# Patient Record
Sex: Female | Born: 1945 | Race: White | Hispanic: No | Smoking: Former smoker
Health system: Southern US, Community
[De-identification: ages and names within clinical notes are randomized; demographics above are authoritative.]

## PROBLEM LIST (undated history)

## (undated) DIAGNOSIS — N189 Chronic kidney disease, unspecified: Secondary | ICD-10-CM

## (undated) DIAGNOSIS — R0600 Dyspnea, unspecified: Secondary | ICD-10-CM

## (undated) DIAGNOSIS — I10 Essential (primary) hypertension: Secondary | ICD-10-CM

## (undated) DIAGNOSIS — E119 Type 2 diabetes mellitus without complications: Secondary | ICD-10-CM

## (undated) DIAGNOSIS — G4489 Other headache syndrome: Secondary | ICD-10-CM

## (undated) DIAGNOSIS — M542 Cervicalgia: Secondary | ICD-10-CM

## (undated) DIAGNOSIS — G454 Transient global amnesia: Secondary | ICD-10-CM

## (undated) HISTORY — PX: EYE SURGERY: SHX253

## (undated) HISTORY — DX: Transient global amnesia: G45.4

## (undated) HISTORY — PX: APPENDECTOMY: SHX54

## (undated) HISTORY — DX: Other headache syndrome: G44.89

---

## 1998-02-11 ENCOUNTER — Emergency Department (HOSPITAL_COMMUNITY): Admission: EM | Admit: 1998-02-11 | Discharge: 1998-02-11 | Payer: Self-pay | Admitting: Emergency Medicine

## 1998-08-07 ENCOUNTER — Emergency Department (HOSPITAL_COMMUNITY): Admission: EM | Admit: 1998-08-07 | Discharge: 1998-08-07 | Payer: Self-pay | Admitting: Emergency Medicine

## 1999-09-07 ENCOUNTER — Encounter: Payer: Self-pay | Admitting: Orthopedic Surgery

## 1999-09-10 ENCOUNTER — Ambulatory Visit (HOSPITAL_COMMUNITY): Admission: RE | Admit: 1999-09-10 | Discharge: 1999-09-10 | Payer: Self-pay | Admitting: Orthopedic Surgery

## 2000-07-23 ENCOUNTER — Ambulatory Visit (HOSPITAL_COMMUNITY): Admission: RE | Admit: 2000-07-23 | Discharge: 2000-07-23 | Payer: Self-pay

## 2000-09-01 ENCOUNTER — Encounter: Payer: Self-pay | Admitting: Family Medicine

## 2000-09-01 ENCOUNTER — Ambulatory Visit (HOSPITAL_COMMUNITY): Admission: RE | Admit: 2000-09-01 | Discharge: 2000-09-01 | Payer: Self-pay | Admitting: Family Medicine

## 2000-12-12 ENCOUNTER — Ambulatory Visit (HOSPITAL_COMMUNITY): Admission: RE | Admit: 2000-12-12 | Discharge: 2000-12-12 | Payer: Self-pay | Admitting: Family Medicine

## 2000-12-12 ENCOUNTER — Encounter: Payer: Self-pay | Admitting: Family Medicine

## 2001-02-26 ENCOUNTER — Encounter: Payer: Self-pay | Admitting: Neurosurgery

## 2001-02-26 ENCOUNTER — Encounter: Admission: RE | Admit: 2001-02-26 | Discharge: 2001-02-26 | Payer: Self-pay | Admitting: Neurosurgery

## 2001-03-12 ENCOUNTER — Encounter: Admission: RE | Admit: 2001-03-12 | Discharge: 2001-04-01 | Payer: Self-pay | Admitting: Neurosurgery

## 2001-09-16 ENCOUNTER — Ambulatory Visit (HOSPITAL_COMMUNITY): Admission: RE | Admit: 2001-09-16 | Discharge: 2001-09-16 | Payer: Self-pay | Admitting: Family Medicine

## 2001-09-16 ENCOUNTER — Encounter: Payer: Self-pay | Admitting: Family Medicine

## 2001-12-07 ENCOUNTER — Encounter: Payer: Self-pay | Admitting: Family Medicine

## 2001-12-07 ENCOUNTER — Ambulatory Visit (HOSPITAL_COMMUNITY): Admission: RE | Admit: 2001-12-07 | Discharge: 2001-12-07 | Payer: Self-pay | Admitting: Family Medicine

## 2002-04-14 ENCOUNTER — Other Ambulatory Visit: Admission: RE | Admit: 2002-04-14 | Discharge: 2002-04-14 | Payer: Self-pay | Admitting: Family Medicine

## 2004-08-22 ENCOUNTER — Ambulatory Visit: Payer: Self-pay | Admitting: Family Medicine

## 2004-08-30 ENCOUNTER — Ambulatory Visit (HOSPITAL_COMMUNITY): Admission: RE | Admit: 2004-08-30 | Discharge: 2004-08-30 | Payer: Self-pay | Admitting: Family Medicine

## 2004-09-04 ENCOUNTER — Ambulatory Visit: Payer: Self-pay | Admitting: Family Medicine

## 2005-05-21 ENCOUNTER — Ambulatory Visit: Payer: Self-pay | Admitting: Family Medicine

## 2005-09-24 ENCOUNTER — Ambulatory Visit: Payer: Self-pay | Admitting: Family Medicine

## 2005-10-30 ENCOUNTER — Ambulatory Visit (HOSPITAL_COMMUNITY): Admission: RE | Admit: 2005-10-30 | Discharge: 2005-10-30 | Payer: Self-pay | Admitting: Family Medicine

## 2005-11-05 ENCOUNTER — Ambulatory Visit: Payer: Self-pay | Admitting: Family Medicine

## 2005-12-11 ENCOUNTER — Ambulatory Visit: Payer: Self-pay | Admitting: Family Medicine

## 2006-06-03 ENCOUNTER — Ambulatory Visit: Payer: Self-pay | Admitting: Family Medicine

## 2006-06-04 ENCOUNTER — Ambulatory Visit: Payer: Self-pay | Admitting: Family Medicine

## 2006-12-31 ENCOUNTER — Ambulatory Visit: Payer: Self-pay | Admitting: Family Medicine

## 2007-02-10 ENCOUNTER — Ambulatory Visit: Payer: Self-pay | Admitting: Family Medicine

## 2007-04-27 ENCOUNTER — Ambulatory Visit (HOSPITAL_COMMUNITY): Admission: RE | Admit: 2007-04-27 | Discharge: 2007-04-27 | Payer: Self-pay | Admitting: Family Medicine

## 2007-07-15 ENCOUNTER — Ambulatory Visit: Payer: Self-pay | Admitting: Internal Medicine

## 2008-01-26 ENCOUNTER — Ambulatory Visit: Payer: Self-pay | Admitting: Internal Medicine

## 2008-01-26 ENCOUNTER — Encounter (INDEPENDENT_AMBULATORY_CARE_PROVIDER_SITE_OTHER): Payer: Self-pay | Admitting: Family Medicine

## 2008-01-26 LAB — CONVERTED CEMR LAB
Albumin: 4.5 g/dL (ref 3.5–5.2)
Alkaline Phosphatase: 85 units/L (ref 39–117)
BUN: 15 mg/dL (ref 6–23)
CO2: 25 meq/L (ref 19–32)
Calcium: 9.8 mg/dL (ref 8.4–10.5)
Chloride: 98 meq/L (ref 96–112)
Glucose, Bld: 245 mg/dL — ABNORMAL HIGH (ref 70–99)
HDL: 42 mg/dL (ref >39)
Potassium: 4.3 meq/L (ref 3.5–5.3)
Sodium: 136 meq/L (ref 135–145)
Total Bilirubin: 0.7 mg/dL (ref 0.3–1.2)

## 2008-02-10 ENCOUNTER — Ambulatory Visit: Payer: Self-pay | Admitting: Internal Medicine

## 2008-05-02 ENCOUNTER — Encounter (INDEPENDENT_AMBULATORY_CARE_PROVIDER_SITE_OTHER): Payer: Self-pay | Admitting: Family Medicine

## 2008-05-02 ENCOUNTER — Ambulatory Visit: Payer: Self-pay | Admitting: Internal Medicine

## 2008-05-02 LAB — CONVERTED CEMR LAB
ALT: 18 units/L (ref 0–35)
Albumin: 4.5 g/dL (ref 3.5–5.2)
BUN: 16 mg/dL (ref 6–23)
CO2: 22 meq/L (ref 19–32)
Calcium: 9.5 mg/dL (ref 8.4–10.5)
Chloride: 102 meq/L (ref 96–112)
Creatinine, Ser: 0.81 mg/dL (ref 0.40–1.20)
Glucose, Bld: 110 mg/dL — ABNORMAL HIGH (ref 70–99)
Potassium: 4.8 meq/L (ref 3.5–5.3)
Total Protein: 7.7 g/dL (ref 6.0–8.3)

## 2008-05-13 ENCOUNTER — Ambulatory Visit: Payer: Self-pay | Admitting: Internal Medicine

## 2008-05-23 ENCOUNTER — Ambulatory Visit (HOSPITAL_COMMUNITY): Admission: RE | Admit: 2008-05-23 | Discharge: 2008-05-23 | Payer: Self-pay | Admitting: Family Medicine

## 2008-08-31 ENCOUNTER — Ambulatory Visit: Payer: Self-pay | Admitting: Internal Medicine

## 2008-08-31 ENCOUNTER — Encounter (INDEPENDENT_AMBULATORY_CARE_PROVIDER_SITE_OTHER): Payer: Self-pay | Admitting: Family Medicine

## 2008-08-31 LAB — CONVERTED CEMR LAB
AST: 18 units/L (ref 0–37)
Alkaline Phosphatase: 53 units/L (ref 39–117)
BUN: 19 mg/dL (ref 6–23)
Chloride: 104 meq/L (ref 96–112)
Glucose, Bld: 104 mg/dL — ABNORMAL HIGH (ref 70–99)
HDL: 52 mg/dL (ref >39)
Potassium: 4.8 meq/L (ref 3.5–5.3)
Sodium: 141 meq/L (ref 135–145)
Total Bilirubin: 0.5 mg/dL (ref 0.3–1.2)
Total CHOL/HDL Ratio: 3.2
Total Protein: 7.5 g/dL (ref 6.0–8.3)
VLDL: 26 mg/dL (ref 0–40)

## 2008-09-14 ENCOUNTER — Ambulatory Visit: Payer: Self-pay | Admitting: Internal Medicine

## 2009-07-10 ENCOUNTER — Ambulatory Visit: Payer: Self-pay | Admitting: Internal Medicine

## 2009-07-12 ENCOUNTER — Ambulatory Visit (HOSPITAL_COMMUNITY): Admission: RE | Admit: 2009-07-12 | Discharge: 2009-07-12 | Payer: Self-pay | Admitting: Internal Medicine

## 2010-12-21 NOTE — Op Note (Signed)
[place]. [facility]  Patient:    Joy Fitzgerald                     MRN: [id number] Proc. Date: 09/10/99 Adm. Date:  [id number] Attending:  Burnard Bunting Dictator:   Graylin Shiver. August Saucer, M.D.                           Operative Report  PREOPERATIVE DIAGNOSIS:  Right knee lateral meniscal tear.  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  A 1 x 1.5 cm  chondral loose body, and patellofemoral osteoarthritis.  PROCEDURE: 1. Right knee examination under anesthesia. 2. Diagnostic arthroscopy. 3. Removal of loose bodies from lateral gutter. 4. Chondroplasty, right femoral condyle. 5. Partial lateral meniscectomy on the partial anterior horn lateral meniscectomy.  SURGEON:  Dr. August Saucer.  ANESTHESIA:  General endotracheal.  ESTIMATED BLOOD LOSS:  None.  DRAINS:  None.  INDICATIONS:  Joy Fitzgerald is a 65 year old patient who has had a several month history of right knee pain, swelling, and locking.  MRI scan demonstrated a complex tear of the anterior and middle aspect of the lateral meniscus.  The patient presents for operative therapy.  OPERATIVE FINDINGS: 1. Examination under anesthesia -  Range of motion 5 to 130 degrees with stability    to varus and valgus, ACL and PCL intact.  The patella was noted to track    slightly laterally, although patella mobility was 1-1/2 quadrants medial and  lateral. 2. Diagnostic operative arthroscopy - Grade II chondromalacia over the lateral    facet of the patella, grade I chondromalacia within the trochlear groove.  A 1 x    1.5 cm loose body within the lateral gutter which was removed through a chondral    flap. 3. Intact medial compartment with intact articular cartilage and meniscus. 4. Intact ACL and PCL. 5. Complex degenerative tear, anterior horn, lateral meniscus, extending about /2    of the entire  circumferential length of the lateral meniscus.  There was a    corresponding area of grade III chondromalacia on the femur, and diffuse grade    III chondromalacia on the tibial plateau.  DESCRIPTION OF PROCEDURE:  The patient was brought to the operating room where general endotracheal anesthesia was induced.  Preoperative intravenous antibiotics were administered.  The knee was examined under anesthesia.  The right knee was  then fitted with the proximal side tracted.  The right knee and leg and hip were prepped with Duraprep solution and draped in a sterile manner.  ______ anatomy f the knee was identified, including the inferior pole, patella, tibia tubercle, unilateral borders of the patellar tendon, as well as the joint line medially and laterally.  The leg was then elevated and exsanguinated with the Esmarch wrap. The tourniquet was inflated.  The anterior/superior lateral portal was then created, and the inflow trocar was inserted.  Scope was then placed in the anterior inferolateral portal.  Diagnostic arthroscopy was performed.  Patellofemoral compartment was observed and found to have some grade II chondromalacia on the lateral facet of the patella.  There was some corresponding grade I chondromalacia within the trochlea and medial facet.  Overall, had some grade I changes, but looked allright.  The medial compartment was freed of loose bodies.  The lateral compartment had a 1 x 1.5 cm chondral fragment within  it.  This was removed under direct visualization.  The medial compartment was then entered.  The anterior inferomedial portal was created under direct visualization.  The chondral surface on the medial femoral condyle was intact.  Medial meniscus, tibia articular surface was intact.  PCL was also intact.  The patient was placed in a figure-of-four position.  The posterior aspect of the lateral meniscus was intact, but frayed.  There was a 1.5 x 1.5 cm area of  grade III chondromalacia surrounded by grade II chondromalacia on the femoral condyle.  The tibial plateau had diffuse grade III chondromalacia changes.  There was a horizontal cleavage tear on the anterior aspect of the lateral meniscus.  This was a full-thickness tear with a flap which was posterior.  Using a combination of the ______  and shaver, as well as another portal created at the medial equator of the patella, the anterior horn of the meniscus was resected.  This was a full-thickness horizontal cleavage tear which was unstable.  The meniscal nodes stable, meniscal rim could be preserved.  I also believe that the meniscal tear was the offending structure which caused the chondral injury on the femur and has been causing the chondromalacia on the tibia. The two flaps of the horizontal cleavage tear were unstable.  These were resected. The remaining posterior horn was stable after probing.  Bleeding points within he knee were controlled using electrocautery.  Chondroplasty was then performed on the loose cartilaginous fragments on the femoral condyle.  No exposed subchondral bone was noted, although there were grade III changes.  Following this, the knee was  thoroughly irrigated.  Portals were closed using 3-0 Nylon suture.  The knee was then placed in a bulky wrap, and the tourniquet was deflated after 1 hour and 10 minutes.  The patient was then transferred to the recovery room in stable condition. DD:  09/10/99 TD:  09/11/99 Job: [id number]

## 2013-03-26 ENCOUNTER — Other Ambulatory Visit (HOSPITAL_COMMUNITY): Payer: Self-pay | Admitting: Family Medicine

## 2013-03-26 DIAGNOSIS — Z1231 Encounter for screening mammogram for malignant neoplasm of breast: Secondary | ICD-10-CM

## 2013-05-03 ENCOUNTER — Other Ambulatory Visit (HOSPITAL_COMMUNITY): Payer: Self-pay | Admitting: Family Medicine

## 2013-05-03 ENCOUNTER — Ambulatory Visit (HOSPITAL_COMMUNITY)
Admission: RE | Admit: 2013-05-03 | Discharge: 2013-05-03 | Disposition: A | Discharge status: Home / Self Care | Payer: Medicare PPO | Source: Ambulatory Visit | Attending: Family Medicine | Admitting: Family Medicine

## 2013-05-03 DIAGNOSIS — Z1231 Encounter for screening mammogram for malignant neoplasm of breast: Secondary | ICD-10-CM

## 2016-09-26 ENCOUNTER — Ambulatory Visit
Admission: RE | Admit: 2016-09-26 | Discharge: 2016-09-26 | Disposition: A | Discharge status: Home / Self Care | Payer: Medicare PPO | Source: Ambulatory Visit | Attending: Family Medicine | Admitting: Family Medicine

## 2016-09-26 ENCOUNTER — Other Ambulatory Visit: Payer: Self-pay | Admitting: Family Medicine

## 2016-09-26 DIAGNOSIS — M545 Low back pain: Secondary | ICD-10-CM

## 2017-11-01 ENCOUNTER — Encounter (HOSPITAL_COMMUNITY): Payer: Self-pay

## 2017-11-01 ENCOUNTER — Emergency Department (HOSPITAL_COMMUNITY)
Admission: EM | Admit: 2017-11-01 | Discharge: 2017-11-01 | Disposition: A | Discharge status: Home / Self Care | Payer: Medicare HMO | Attending: Emergency Medicine | Admitting: Emergency Medicine

## 2017-11-01 ENCOUNTER — Emergency Department (HOSPITAL_COMMUNITY): Payer: Medicare HMO

## 2017-11-01 DIAGNOSIS — E119 Type 2 diabetes mellitus without complications: Secondary | ICD-10-CM | POA: Diagnosis not present

## 2017-11-01 DIAGNOSIS — M542 Cervicalgia: Secondary | ICD-10-CM | POA: Diagnosis present

## 2017-11-01 DIAGNOSIS — F17210 Nicotine dependence, cigarettes, uncomplicated: Secondary | ICD-10-CM | POA: Insufficient documentation

## 2017-11-01 DIAGNOSIS — I10 Essential (primary) hypertension: Secondary | ICD-10-CM | POA: Insufficient documentation

## 2017-11-01 DIAGNOSIS — R52 Pain, unspecified: Secondary | ICD-10-CM

## 2017-11-01 HISTORY — DX: Essential (primary) hypertension: I10

## 2017-11-01 HISTORY — DX: Cervicalgia: M54.2

## 2017-11-01 HISTORY — DX: Type 2 diabetes mellitus without complications: E11.9

## 2017-11-01 LAB — CBC WITH DIFFERENTIAL/PLATELET
BASOS PCT: 0 %
Basophils Absolute: 0 10*3/uL (ref 0.0–0.1)
Eosinophils Absolute: 0.1 10*3/uL (ref 0.0–0.7)
Eosinophils Relative: 1 %
HEMATOCRIT: 32.3 % — AB (ref 36.0–46.0)
HEMOGLOBIN: 10.1 g/dL — AB (ref 12.0–15.0)
LYMPHS ABS: 2.6 10*3/uL (ref 0.7–4.0)
LYMPHS PCT: 28 %
MCH: 27.7 pg (ref 26.0–34.0)
MCHC: 31.3 g/dL (ref 30.0–36.0)
MCV: 88.5 fL (ref 78.0–100.0)
MONOS PCT: 10 %
Monocytes Absolute: 1 10*3/uL (ref 0.1–1.0)
NEUTROS ABS: 5.8 10*3/uL (ref 1.7–7.7)
NEUTROS PCT: 61 %
Platelets: 305 10*3/uL (ref 150–400)
RBC: 3.65 MIL/uL — ABNORMAL LOW (ref 3.87–5.11)
RDW: 15.4 % (ref 11.5–15.5)
WBC: 9.5 10*3/uL (ref 4.0–10.5)

## 2017-11-01 LAB — BASIC METABOLIC PANEL
Anion gap: 11 (ref 5–15)
BUN: 15 mg/dL (ref 6–20)
CHLORIDE: 100 mmol/L — AB (ref 101–111)
CO2: 25 mmol/L (ref 22–32)
CREATININE: 1.46 mg/dL — AB (ref 0.44–1.00)
Calcium: 9.5 mg/dL (ref 8.9–10.3)
GFR calc non Af Amer: 35 mL/min — ABNORMAL LOW (ref >60)
GFR, EST AFRICAN AMERICAN: 40 mL/min — AB (ref >60)
Glucose, Bld: 145 mg/dL — ABNORMAL HIGH (ref 65–99)
Potassium: 3.9 mmol/L (ref 3.5–5.1)
Sodium: 136 mmol/L (ref 135–145)

## 2017-11-01 LAB — I-STAT TROPONIN, ED: Troponin i, poc: 0.01 ng/mL (ref 0.00–0.08)

## 2017-11-01 MED ORDER — MORPHINE SULFATE (PF) 4 MG/ML IV SOLN
4.0000 mg | Freq: Once | INTRAVENOUS | Status: AC
  Administered 2017-11-01: 4 mg via INTRAVENOUS
  Filled 2017-11-01: qty 1

## 2017-11-01 MED ORDER — KETOROLAC TROMETHAMINE 30 MG/ML IJ SOLN
15.0000 mg | Freq: Once | INTRAMUSCULAR | Status: AC
  Administered 2017-11-01: 15 mg via INTRAVENOUS
  Filled 2017-11-01: qty 1

## 2017-11-01 NOTE — ED Provider Notes (Signed)
[facility] EMERGENCY DEPARTMENT Provider Note   CSN: [id number] Arrival date & time: 11/01/17  4098     History   Chief Complaint No chief complaint on file.   HPI Joy Fitzgerald is a 72 y.o. female.  72 year old female with prior history of diabetes, hypertension, and chronic neck pain presents with complaint of acute on chronic neck pain.  Patient reports a prior history of "pinched nerves"in her neck.  This is a chronic problem.  She takes Percocets (10mg s) routinely for this pain.  Her last dose of Percocet was yesterday.  She reports that on Wednesday she did lots of heavy lifting of furniture.  After the lifting she began to have more pain on Thursday and then Friday.  She reports that the pain today radiates from the posterior aspect of her neck to both shoulders and into her anterior chest.  She not take anything for her pain today.  She reports that the pain is worse with rotation or flexion and extension of her neck.  She did fever, cough, shortness of breath, nausea, vomiting, diaphoresis, or other acute complaint.  She reports that she does not routinely come to the hospital for her chronic pain.  She denies prior history of coronary artery disease or cardiac issues.  The history is provided by the patient and a relative.  Neck Injury  This is a chronic problem. The current episode started more than 2 days ago. The problem occurs every several days. The problem has not changed since onset.Associated symptoms include chest pain. The symptoms are aggravated by twisting and bending. Nothing relieves the symptoms. She has tried acetaminophen for the symptoms. Improvement on treatment: She has taken Percocets at home without improvement.    Past Medical History:  Diagnosis Date  . Diabetes mellitus without complication (HCC)   . Hypertension   . Neck pain     There are no active problems to display for this patient.   History reviewed. No pertinent  surgical history.   OB History   None      Home Medications    Prior to Admission medications   Not on File    Family History No family history on file.  Social History Social History   Tobacco Use  . Smoking status: Current Every Day Smoker  . Smokeless tobacco: Never Used  Substance Use Topics  . Alcohol use: Never    Frequency: Never  . Drug use: Not on file     Allergies   Patient has no known allergies.   Review of Systems Review of Systems  Cardiovascular: Positive for chest pain.  Musculoskeletal: Positive for neck pain.  All other systems reviewed and are negative.    Physical Exam Updated Vital Signs BP 102/69   Pulse 78   Temp 99 F (37.2 C) (Oral)   Resp 14   SpO2 100%   Physical Exam  Constitutional: She is oriented to person, place, and time. She appears well-developed and well-nourished. No distress.  HENT:  Head: Normocephalic and atraumatic.  Mouth/Throat: Oropharynx is clear and moist.  Eyes: Pupils are equal, round, and reactive to light. Conjunctivae and EOM are normal.  Neck: Normal range of motion. Neck supple.  Cardiovascular: Normal rate, regular rhythm and normal heart sounds.  Pulmonary/Chest: Effort normal and breath sounds normal. No respiratory distress.  Abdominal: Soft. She exhibits no distension. There is no tenderness.  Musculoskeletal: Normal range of motion. She exhibits no edema or deformity.  Neurological: She is  alert and oriented to person, place, and time. She displays normal reflexes. No cranial nerve deficit or sensory deficit. She exhibits normal muscle tone. Coordination normal.  Alert Oriented No facial droop Normal speech Normal gait 5 out of 5 strength in both upper and lower extremities  Skin: Skin is warm and dry.  Psychiatric: She has a normal mood and affect.  Nursing note and vitals reviewed.    ED Treatments / Results  Labs (all labs ordered are listed, but only abnormal results are  displayed) Labs Reviewed  CBC WITH DIFFERENTIAL/PLATELET - Abnormal; Notable for the following components:      Result Value   RBC 3.65 (*)    Hemoglobin 10.1 (*)    HCT 32.3 (*)    All other components within normal limits  BASIC METABOLIC PANEL - Abnormal; Notable for the following components:   Chloride 100 (*)    Glucose, Bld 145 (*)    Creatinine, Ser 1.46 (*)    GFR calc non Af Amer 35 (*)    GFR calc Af Amer 40 (*)    All other components within normal limits  I-STAT TROPONIN, ED    EKG EKG Interpretation  Date/Time:  Saturday November 01 2017 13:52:42 EDT Ventricular Rate:  77 PR Interval:    QRS Duration: 88 QT Interval:  367 QTC Calculation: 416 R Axis:   95 Text Interpretation:  Sinus rhythm Low voltage with right axis deviation Confirmed by Kristine RoyalMessick, Cyanne Delmar [phone]([id number]) on 11/01/2017 2:17:12 PM   Radiology Dg Chest 1 View  Result Date: 11/01/2017 CLINICAL DATA:  Chest pain. EXAM: CHEST  1 VIEW COMPARISON:  None FINDINGS: Normal heart size. There is no pleural effusion or edema. Linear atelectasis identified within the left base. No airspace opacities. IMPRESSION: 1. Linear atelectasis noted in the left base. Electronically Signed   By: Signa Kellaylor  Stroud M.D.   On: 11/01/2017 14:51    Procedures Procedures (including critical care time)  Medications Ordered in ED Medications  morphine 4 MG/ML injection 4 mg (4 mg Intravenous Given 11/01/17 1457)  morphine 4 MG/ML injection 4 mg (4 mg Intravenous Given 11/01/17 1553)  ketorolac (TORADOL) 30 MG/ML injection 15 mg (15 mg Intravenous Given 11/01/17 1553)     Initial Impression / Assessment and Plan / ED Course  I have reviewed the triage vital signs and the nursing notes.  Pertinent labs & imaging results that were available during my care of the patient were reviewed by me and considered in my medical decision making (see chart for details).     MDM  Screen Complete  Patient's presentation is most consistent with  likely cervicalgia related to a reported history of a "pinched nerve."  Patient's pain is acute on chronic in nature.  Patient feels improved following ED administration of morphine and Toradol.  Screening labs do not suggest other pathology such as ACS or a pulmonary process.  Screening troponin is negative.  EKG is without findings suggestive of ischemia.  Chest x-ray is without significant acute findings.  Other baseline labs are without significant findings.  Patient does feel improved following her ED evaluation.  Close follow-up is advised.  Strict return precautions given to the patient and her daughter at bedside.  She will continue her home use of Percocet and also use Naprosyn concurrently for management of her pain until she can follow-up with her regular doctor on Monday.   Final Clinical Impressions(s) / ED Diagnoses   Final diagnoses:  Neck pain  ED Discharge Orders    None       Wynetta Fines, MD 11/01/17 1616

## 2017-11-01 NOTE — ED Triage Notes (Signed)
Patient complains of chronic neck pain due to pinched nerve in neck, describes as chronic. States that the pain is worse after moving furniture. Also small burn to right posterior shoulder after laying on hitting pad, blister intact

## 2017-11-01 NOTE — ED Notes (Signed)
Patient returned from XRay

## 2017-11-01 NOTE — Discharge Instructions (Addendum)
Continue to use Percocet and Naprosyn as previously prescribed. Return for any problem.

## 2017-11-01 NOTE — ED Notes (Signed)
Got patient from the waiting room into a gown took patient to the bathroom patient is now resting with family and the doctor at bedside

## 2017-11-01 NOTE — ED Notes (Signed)
Got the patient

## 2017-11-01 NOTE — ED Notes (Signed)
Patient transported to X-ray 

## 2018-05-26 ENCOUNTER — Emergency Department (HOSPITAL_COMMUNITY): Payer: Medicare HMO

## 2018-05-26 ENCOUNTER — Other Ambulatory Visit: Payer: Self-pay

## 2018-05-26 ENCOUNTER — Emergency Department (HOSPITAL_COMMUNITY)
Admission: EM | Admit: 2018-05-26 | Discharge: 2018-05-26 | Disposition: A | Discharge status: Home / Self Care | Payer: Medicare HMO | Attending: Emergency Medicine | Admitting: Emergency Medicine

## 2018-05-26 ENCOUNTER — Encounter (HOSPITAL_COMMUNITY): Payer: Self-pay | Admitting: Pharmacy Technician

## 2018-05-26 DIAGNOSIS — I10 Essential (primary) hypertension: Secondary | ICD-10-CM | POA: Insufficient documentation

## 2018-05-26 DIAGNOSIS — Z7984 Long term (current) use of oral hypoglycemic drugs: Secondary | ICD-10-CM | POA: Insufficient documentation

## 2018-05-26 DIAGNOSIS — Z7982 Long term (current) use of aspirin: Secondary | ICD-10-CM | POA: Insufficient documentation

## 2018-05-26 DIAGNOSIS — F17210 Nicotine dependence, cigarettes, uncomplicated: Secondary | ICD-10-CM | POA: Diagnosis not present

## 2018-05-26 DIAGNOSIS — Z79899 Other long term (current) drug therapy: Secondary | ICD-10-CM | POA: Diagnosis not present

## 2018-05-26 DIAGNOSIS — R4182 Altered mental status, unspecified: Secondary | ICD-10-CM | POA: Diagnosis present

## 2018-05-26 DIAGNOSIS — G454 Transient global amnesia: Secondary | ICD-10-CM | POA: Diagnosis not present

## 2018-05-26 DIAGNOSIS — E119 Type 2 diabetes mellitus without complications: Secondary | ICD-10-CM | POA: Insufficient documentation

## 2018-05-26 LAB — COMPREHENSIVE METABOLIC PANEL
ALT: 11 U/L (ref 0–44)
ANION GAP: 9 (ref 5–15)
AST: 19 U/L (ref 15–41)
Albumin: 3.5 g/dL (ref 3.5–5.0)
Alkaline Phosphatase: 49 U/L (ref 38–126)
BILIRUBIN TOTAL: 0.9 mg/dL (ref 0.3–1.2)
BUN: 20 mg/dL (ref 8–23)
CALCIUM: 9.5 mg/dL (ref 8.9–10.3)
CO2: 24 mmol/L (ref 22–32)
Chloride: 106 mmol/L (ref 98–111)
Creatinine, Ser: 1.87 mg/dL — ABNORMAL HIGH (ref 0.44–1.00)
GFR, EST AFRICAN AMERICAN: 30 mL/min — AB (ref >60)
GFR, EST NON AFRICAN AMERICAN: 26 mL/min — AB (ref >60)
Glucose, Bld: 109 mg/dL — ABNORMAL HIGH (ref 70–99)
Potassium: 4.2 mmol/L (ref 3.5–5.1)
SODIUM: 139 mmol/L (ref 135–145)
TOTAL PROTEIN: 6.5 g/dL (ref 6.5–8.1)

## 2018-05-26 LAB — APTT: APTT: 34 s (ref 24–36)

## 2018-05-26 LAB — I-STAT CHEM 8, ED
BUN: 23 mg/dL (ref 8–23)
CALCIUM ION: 1.2 mmol/L (ref 1.15–1.40)
CHLORIDE: 106 mmol/L (ref 98–111)
Creatinine, Ser: 1.9 mg/dL — ABNORMAL HIGH (ref 0.44–1.00)
Glucose, Bld: 105 mg/dL — ABNORMAL HIGH (ref 70–99)
HEMATOCRIT: 35 % — AB (ref 36.0–46.0)
Hemoglobin: 11.9 g/dL — ABNORMAL LOW (ref 12.0–15.0)
POTASSIUM: 4.2 mmol/L (ref 3.5–5.1)
SODIUM: 140 mmol/L (ref 135–145)
TCO2: 25 mmol/L (ref 22–32)

## 2018-05-26 LAB — CBC
HEMATOCRIT: 34.7 % — AB (ref 36.0–46.0)
Hemoglobin: 11.2 g/dL — ABNORMAL LOW (ref 12.0–15.0)
MCH: 30.8 pg (ref 26.0–34.0)
MCHC: 32.3 g/dL (ref 30.0–36.0)
MCV: 95.3 fL (ref 80.0–100.0)
Platelets: 256 10*3/uL (ref 150–400)
RBC: 3.64 MIL/uL — ABNORMAL LOW (ref 3.87–5.11)
RDW: 14.1 % (ref 11.5–15.5)
WBC: 6.4 10*3/uL (ref 4.0–10.5)
nRBC: 0 % (ref 0.0–0.2)

## 2018-05-26 LAB — RAPID URINE DRUG SCREEN, HOSP PERFORMED
Amphetamines: NOT DETECTED
Barbiturates: NOT DETECTED
Benzodiazepines: POSITIVE — AB
Cocaine: NOT DETECTED
OPIATES: NOT DETECTED
Tetrahydrocannabinol: NOT DETECTED

## 2018-05-26 LAB — CBG MONITORING, ED: Glucose-Capillary: 100 mg/dL — ABNORMAL HIGH (ref 70–99)

## 2018-05-26 LAB — URINALYSIS, ROUTINE W REFLEX MICROSCOPIC
Bilirubin Urine: NEGATIVE
Glucose, UA: NEGATIVE mg/dL
Hgb urine dipstick: NEGATIVE
Ketones, ur: NEGATIVE mg/dL
Leukocytes, UA: NEGATIVE
NITRITE: NEGATIVE
PH: 5 (ref 5.0–8.0)
Protein, ur: NEGATIVE mg/dL
SPECIFIC GRAVITY, URINE: 1.005 (ref 1.005–1.030)

## 2018-05-26 LAB — PROTIME-INR
INR: 1.11
PROTHROMBIN TIME: 14.2 s (ref 11.4–15.2)

## 2018-05-26 LAB — I-STAT TROPONIN, ED: TROPONIN I, POC: 0 ng/mL (ref 0.00–0.08)

## 2018-05-26 LAB — DIFFERENTIAL
Abs Immature Granulocytes: 0.01 10*3/uL (ref 0.00–0.07)
BASOS ABS: 0 10*3/uL (ref 0.0–0.1)
Basophils Relative: 1 %
EOS PCT: 4 %
Eosinophils Absolute: 0.2 10*3/uL (ref 0.0–0.5)
Immature Granulocytes: 0 %
LYMPHS ABS: 2.9 10*3/uL (ref 0.7–4.0)
LYMPHS PCT: 44 %
MONO ABS: 0.6 10*3/uL (ref 0.1–1.0)
Monocytes Relative: 9 %
Neutro Abs: 2.7 10*3/uL (ref 1.7–7.7)
Neutrophils Relative %: 42 %

## 2018-05-26 LAB — ETHANOL

## 2018-05-26 NOTE — ED Notes (Signed)
Pt returned from MRI, family remains at the bedside. Pt in NAD resting comfortably.

## 2018-05-26 NOTE — ED Triage Notes (Signed)
Pt arrives via EMS from home with reports of sudden onset AMS. LKW 1400 today. Pt alert to first name. Unable to recall last name, birthdate or age. VSS with EMS. 170/90, HR 50 sinus brady, CBG 125.

## 2018-05-26 NOTE — ED Provider Notes (Signed)
[facility] EMERGENCY DEPARTMENT Provider Note   CSN: [id number] Arrival date & time: 05/26/18  1522     History   Chief Complaint Chief Complaint  Patient presents with  . Altered Mental Status    HPI [place] Cancel is a 72 y.o. female.  HPI Patient is a 72 year old female presents the emergency department with acute onset confusion and memory loss.  She was last known well at 1400 today.  She is brought to the emergency department via EMS.  She denies weakness of her arms or legs.  No dysarthria.  No difficulty with her speech.  She does have a history of diabetes and hypertension.  She does not drink alcohol.  She does not smoke cigarettes.  No prior history of stroke.  She has difficulty remembering her last name and her date of birth.  She is had no recent head injury or trauma.  This is very abnormal for the patient per the family.  They state that since her symptoms initially began she has had some mild improvement in her memory but that is not back to baseline at this time.   Past Medical History:  Diagnosis Date  . Diabetes mellitus without complication (HCC)   . Hypertension   . Neck pain     There are no active problems to display for this patient.   History reviewed. No pertinent surgical history.   OB History   None      Home Medications    Prior to Admission medications   Medication Sig Start Date End Date Taking? Authorizing Provider  ALPRAZolam Prudy Feeler) 1 MG tablet Take 1 mg by mouth See admin instructions. Take 1 mg by mouth at bedtime and an additional 1 mg up to two times a day as needed for anxiety   Yes [provider]  aspirin EC 81 MG tablet Take 81 mg by mouth daily.   Yes [provider]  ferrous sulfate 325 (65 FE) MG EC tablet Take 325 mg by mouth daily with breakfast.   Yes [provider]  losartan (COZAAR) 100 MG tablet Take 100 mg by mouth daily.   Yes [provider]  metFORMIN  (GLUCOPHAGE) 500 MG tablet Take 500 mg by mouth 2 (two) times daily with a meal.   Yes [provider]  naproxen (NAPROSYN) 500 MG tablet Take 500 mg by mouth 2 (two) times daily with a meal.   Yes [provider]  nebivolol (BYSTOLIC) 10 MG tablet Take 10 mg by mouth daily.   Yes [provider]  omeprazole (PRILOSEC) 20 MG capsule Take 20 mg by mouth daily before breakfast.   Yes [provider]  oxyCODONE-acetaminophen (PERCOCET) 10-325 MG tablet Take 1 tablet by mouth See admin instructions. Take 1 tablet by mouth every four to six hours   Yes [provider]  pravastatin (PRAVACHOL) 40 MG tablet Take 40 mg by mouth daily after lunch.   Yes [provider]  vitamin B-12 (CYANOCOBALAMIN) 1000 MCG tablet Take 1,000 mcg by mouth daily.   Yes [provider]    Family History No family history on file.  Social History Social History   Tobacco Use  . Smoking status: Current Every Day Smoker  . Smokeless tobacco: Never Used  Substance Use Topics  . Alcohol use: Never    Frequency: Never  . Drug use: Not on file     Allergies   Patient has no known allergies.   Review of  Systems Review of Systems  All other systems reviewed and are negative.    Physical Exam Updated Vital Signs BP (!) 190/71   Pulse (!) 52   Temp 98.5 F (36.9 C) (Oral)   Resp 13   Ht 5\' 8"  (1.727 m)   Wt 81.6 kg   SpO2 97%   BMI 27.37 kg/m   Physical Exam  Constitutional: She is oriented to person, place, and time. She appears well-developed and well-nourished. No distress.  HENT:  Head: Normocephalic and atraumatic.  Eyes: Pupils are equal, round, and reactive to light. EOM are normal.  Neck: Normal range of motion.  Cardiovascular: Normal rate, regular rhythm and normal heart sounds.  Pulmonary/Chest: Effort normal and breath sounds normal.  Abdominal: Soft. She exhibits no distension. There is no tenderness.  Musculoskeletal:  Normal range of motion.  Neurological: She is alert and oriented to person, place, and time.  5/5 strength in major muscle groups of  bilateral upper and lower extremities. Speech normal. No facial asymetry.  Some memory impairment.  Does not know her last name.  Does not know her date of birth.  She is able to name a pen and watch.  Skin: Skin is warm and dry.  Psychiatric: She has a normal mood and affect. Judgment normal.  Nursing note and vitals reviewed.    ED Treatments / Results  Labs (all labs ordered are listed, but only abnormal results are displayed) Labs Reviewed  CBC - Abnormal; Notable for the following components:      Result Value   RBC 3.64 (*)    Hemoglobin 11.2 (*)    HCT 34.7 (*)    All other components within normal limits  COMPREHENSIVE METABOLIC PANEL - Abnormal; Notable for the following components:   Glucose, Bld 109 (*)    Creatinine, Ser 1.87 (*)    GFR calc non Af Amer 26 (*)    GFR calc Af Amer 30 (*)    All other components within normal limits  RAPID URINE DRUG SCREEN, HOSP PERFORMED - Abnormal; Notable for the following components:   Benzodiazepines POSITIVE (*)    All other components within normal limits  URINALYSIS, ROUTINE W REFLEX MICROSCOPIC - Abnormal; Notable for the following components:   Color, Urine STRAW (*)    All other components within normal limits  CBG MONITORING, ED - Abnormal; Notable for the following components:   Glucose-Capillary 100 (*)    All other components within normal limits  I-STAT CHEM 8, ED - Abnormal; Notable for the following components:   Creatinine, Ser 1.90 (*)    Glucose, Bld 105 (*)    Hemoglobin 11.9 (*)    HCT 35.0 (*)    All other components within normal limits  ETHANOL  PROTIME-INR  APTT  DIFFERENTIAL  I-STAT TROPONIN, ED    EKG None  Radiology Dg Chest 2 View  Result Date: 05/26/2018 CLINICAL DATA:  Altered mental status EXAM: CHEST - 2 VIEW COMPARISON:  11/01/2017 chest radiograph.  FINDINGS: Stable cardiomediastinal silhouette with normal heart size. No pneumothorax. No pleural effusion. Mild left basilar scarring versus atelectasis. No pulmonary edema. No acute consolidative airspace disease. IMPRESSION: Mild left basilar scarring versus atelectasis. Electronically Signed   By: Delbert Phenix M.D.   On: 05/26/2018 18:49   Ct Head Wo Contrast  Result Date: 05/26/2018 CLINICAL DATA:  Altered mental status.  No reported injury. EXAM: CT HEAD WITHOUT CONTRAST TECHNIQUE: Contiguous axial images were obtained from the base of the skull  through the vertex without intravenous contrast. COMPARISON:  None. FINDINGS: Brain: No evidence of parenchymal hemorrhage or extra-axial fluid collection. No mass lesion, mass effect, or midline shift. No CT evidence of acute infarction. Nonspecific mild subcortical and periventricular white matter hypodensity, most in keeping with chronic small vessel ischemic change. Cerebral volume is age appropriate. No ventriculomegaly. Vascular: No acute abnormality. Skull: No evidence of calvarial fracture. Sinuses/Orbits: No fluid levels. Tiny mucous retention cyst versus polyp in posterior right ethmoidal air cells. Other: Partial inferior left mastoid effusion. Clear right mastoid air cells. IMPRESSION: 1.  No evidence of acute intracranial abnormality. 2. Mild chronic small vessel ischemic changes in the cerebral white matter. 3. Nonspecific small partial left mastoid effusion. Electronically Signed   By: Delbert Phenix M.D.   On: 05/26/2018 18:01   Mr Brain Wo Contrast  Result Date: 05/26/2018 CLINICAL DATA:  Memory loss. Altered mental status. Last seen normal 1400 hours. EXAM: MRI HEAD WITHOUT CONTRAST TECHNIQUE: Multiplanar, multiecho pulse sequences of the brain and surrounding structures were obtained without intravenous contrast. COMPARISON:  Head CT earlier same day FINDINGS: Brain: Diffusion imaging does not show any acute or subacute infarction. There are  mild chronic small-vessel ischemic changes of the pons. Few old small vessel cerebellar infarctions. Cerebral hemispheres show mild to moderate chronic small-vessel change of the white matter. No cortical or large vessel territory infarction. No mass lesion, hemorrhage, hydrocephalus or extra-axial collection. Vascular: Major vessels at the base of the brain show flow. Skull and upper cervical spine: Negative Sinuses/Orbits: Clear/normal Other: None IMPRESSION: No acute finding. Mild to moderate chronic small-vessel ischemic changes of the pons and the cerebral hemispheric white matter. Electronically Signed   By: Paulina Fusi M.D.   On: 05/26/2018 20:18    Procedures Procedures (including critical care time)  Medications Ordered in ED Medications - No data to display   Initial Impression / Assessment and Plan / ED Course  I have reviewed the triage vital signs and the nursing notes.  Pertinent labs & imaging results that were available during my care of the patient were reviewed by me and considered in my medical decision making (see chart for details).     Work-up in the emergency department is without significant abnormality.  MRI demonstrates no evidence of acute stroke.  This is likely transient global amnesia.  I do not believe the patient needs additional work-up in the ER at this time.  She does not need admission to the hospital.  She is stable for discharge from the ER with outpatient neurology work-up.  Her memory has continued to improve here in the emergency department.  Outpatient EEG may be beneficial.  No seizure activity noted by family.  No tongue biting.  No urinary incontinence.  Patient family encouraged to return to the emergency department for new or worsening symptoms  Final Clinical Impressions(s) / ED Diagnoses   Final diagnoses:  Transient global amnesia    ED Discharge Orders    None       Azalia Bilis, MD 05/26/18 2234

## 2018-05-26 NOTE — ED Notes (Signed)
ED Provider at bedside. 

## 2018-06-12 ENCOUNTER — Other Ambulatory Visit: Payer: Self-pay

## 2018-06-12 ENCOUNTER — Emergency Department (HOSPITAL_COMMUNITY): Payer: Medicare HMO

## 2018-06-12 ENCOUNTER — Encounter (HOSPITAL_COMMUNITY): Payer: Self-pay

## 2018-06-12 ENCOUNTER — Emergency Department (HOSPITAL_COMMUNITY)
Admission: EM | Admit: 2018-06-12 | Discharge: 2018-06-12 | Disposition: A | Discharge status: Home / Self Care | Payer: Medicare HMO | Attending: Emergency Medicine | Admitting: Emergency Medicine

## 2018-06-12 DIAGNOSIS — I10 Essential (primary) hypertension: Secondary | ICD-10-CM | POA: Insufficient documentation

## 2018-06-12 DIAGNOSIS — R51 Headache: Secondary | ICD-10-CM | POA: Diagnosis not present

## 2018-06-12 DIAGNOSIS — R519 Headache, unspecified: Secondary | ICD-10-CM

## 2018-06-12 DIAGNOSIS — F17210 Nicotine dependence, cigarettes, uncomplicated: Secondary | ICD-10-CM | POA: Insufficient documentation

## 2018-06-12 DIAGNOSIS — Z7984 Long term (current) use of oral hypoglycemic drugs: Secondary | ICD-10-CM | POA: Diagnosis not present

## 2018-06-12 DIAGNOSIS — Z7982 Long term (current) use of aspirin: Secondary | ICD-10-CM | POA: Insufficient documentation

## 2018-06-12 DIAGNOSIS — E119 Type 2 diabetes mellitus without complications: Secondary | ICD-10-CM | POA: Insufficient documentation

## 2018-06-12 DIAGNOSIS — Z79899 Other long term (current) drug therapy: Secondary | ICD-10-CM | POA: Diagnosis not present

## 2018-06-12 DIAGNOSIS — R001 Bradycardia, unspecified: Secondary | ICD-10-CM | POA: Diagnosis not present

## 2018-06-12 LAB — CBC WITH DIFFERENTIAL/PLATELET
Abs Immature Granulocytes: 0.03 10*3/uL (ref 0.00–0.07)
BASOS ABS: 0 10*3/uL (ref 0.0–0.1)
Basophils Relative: 0 %
Eosinophils Absolute: 0.1 10*3/uL (ref 0.0–0.5)
Eosinophils Relative: 1 %
HEMATOCRIT: 40.8 % (ref 36.0–46.0)
HEMOGLOBIN: 13.1 g/dL (ref 12.0–15.0)
IMMATURE GRANULOCYTES: 0 %
LYMPHS PCT: 18 %
Lymphs Abs: 1.8 10*3/uL (ref 0.7–4.0)
MCH: 30.5 pg (ref 26.0–34.0)
MCHC: 32.1 g/dL (ref 30.0–36.0)
MCV: 94.9 fL (ref 80.0–100.0)
Monocytes Absolute: 0.3 10*3/uL (ref 0.1–1.0)
Monocytes Relative: 3 %
NEUTROS ABS: 7.9 10*3/uL — AB (ref 1.7–7.7)
NEUTROS PCT: 78 %
NRBC: 0 % (ref 0.0–0.2)
Platelets: 278 10*3/uL (ref 150–400)
RBC: 4.3 MIL/uL (ref 3.87–5.11)
RDW: 13.3 % (ref 11.5–15.5)
WBC: 10.1 10*3/uL (ref 4.0–10.5)

## 2018-06-12 LAB — COMPREHENSIVE METABOLIC PANEL
ALBUMIN: 3.9 g/dL (ref 3.5–5.0)
ALK PHOS: 46 U/L (ref 38–126)
ALT: 10 U/L (ref 0–44)
ANION GAP: 10 (ref 5–15)
AST: 21 U/L (ref 15–41)
BUN: 20 mg/dL (ref 8–23)
CHLORIDE: 108 mmol/L (ref 98–111)
CO2: 21 mmol/L — AB (ref 22–32)
Calcium: 9.6 mg/dL (ref 8.9–10.3)
Creatinine, Ser: 1.74 mg/dL — ABNORMAL HIGH (ref 0.44–1.00)
GFR calc Af Amer: 32 mL/min — ABNORMAL LOW (ref >60)
GFR calc non Af Amer: 28 mL/min — ABNORMAL LOW (ref >60)
Glucose, Bld: 113 mg/dL — ABNORMAL HIGH (ref 70–99)
POTASSIUM: 5.1 mmol/L (ref 3.5–5.1)
SODIUM: 139 mmol/L (ref 135–145)
Total Bilirubin: 1.2 mg/dL (ref 0.3–1.2)
Total Protein: 7.6 g/dL (ref 6.5–8.1)

## 2018-06-12 LAB — CBG MONITORING, ED: GLUCOSE-CAPILLARY: 117 mg/dL — AB (ref 70–99)

## 2018-06-12 LAB — LIPASE, BLOOD: Lipase: 18 U/L (ref 11–51)

## 2018-06-12 MED ORDER — SODIUM CHLORIDE 0.9 % IV BOLUS
500.0000 mL | Freq: Once | INTRAVENOUS | Status: AC
  Administered 2018-06-12: 500 mL via INTRAVENOUS

## 2018-06-12 MED ORDER — ONDANSETRON HCL 4 MG/2ML IJ SOLN
4.0000 mg | Freq: Once | INTRAMUSCULAR | Status: AC
  Administered 2018-06-12: 4 mg via INTRAVENOUS
  Filled 2018-06-12: qty 2

## 2018-06-12 MED ORDER — ACETAMINOPHEN 325 MG PO TABS
650.0000 mg | ORAL_TABLET | Freq: Once | ORAL | Status: AC
  Administered 2018-06-12: 650 mg via ORAL
  Filled 2018-06-12: qty 2

## 2018-06-12 MED ORDER — MORPHINE SULFATE (PF) 4 MG/ML IV SOLN
4.0000 mg | Freq: Once | INTRAVENOUS | Status: AC
  Administered 2018-06-12: 4 mg via INTRAVENOUS
  Filled 2018-06-12: qty 1

## 2018-06-12 NOTE — ED Triage Notes (Signed)
Pt arrives to ED with complaints of headache, nausea, and vomiting 2-3 times since last night, denies falling or having hit her head in the last 2 weeks. No neuro symptoms upon arrival to exam room. Pt placed in position of comfort with call bel in reach.

## 2018-06-12 NOTE — ED Notes (Signed)
ED Provider at bedside. 

## 2018-06-12 NOTE — Discharge Instructions (Addendum)
You were seen in the emergency department today for a headache.  Your lab work all looks similar to prior lab work you have had done.  Your CT scan did not show any new findings such as an acute stroke or bleed and seemed similar to the MRI you had done recently.   Please take Tylenol per over-the-counter dosing instructions to help with headache should return or worsen.  Please follow-up with your primary care provider within 3 days.  Return to the ER for new or worsening symptoms or any other concerns.

## 2018-06-12 NOTE — ED Notes (Signed)
Pt states she did not take any of her daily meds this morning. Assessment of patient is difficult r/t pt moaning in pain with every breath.

## 2018-06-12 NOTE — ED Notes (Signed)
Pt provided with ginger ale and water

## 2018-06-12 NOTE — ED Notes (Signed)
Patient ambulatory to bathroom with steady gait at this time 

## 2018-06-12 NOTE — ED Provider Notes (Signed)
[facility] EMERGENCY DEPARTMENT Provider Note   CSN: [id number] Arrival date & time: 06/12/18  9562   History   Chief Complaint Chief Complaint  Patient presents with  . Headache    HPI Joy Fitzgerald is a 72 y.o. female with a hx or tobacco abuse, DM, HTN, and chronic pain who presents to the ED with complaints of headache since discharge from last ED visit 10/22. She states headache is diffuse to her entire head, worse posteriorly. She states constant since discharge from the ER, but seemed to worsen over the past 24 hours. Current pain is a 10/10 aching/shooting/splitting in nature without specific alleviating/aggravating factors. She has not tried specific intervention prior to arrival. Patient reports headache lead to nausea with 3 episodes of non bloody emesis. Has not necessarily had a similar headache. Denies head injury. Denies fever, chills, abdominal pain, numbness, weakness, paresthesias, confusion, facial droop, difficulty with ambulation, or abnormal behavior- daughter at bedside confirms.   HPI  Past Medical History:  Diagnosis Date  . Diabetes mellitus without complication (HCC)   . Hypertension   . Neck pain     There are no active problems to display for this patient.   History reviewed. No pertinent surgical history.   OB History   None      Home Medications    Prior to Admission medications   Medication Sig Start Date End Date Taking? Authorizing Provider  ALPRAZolam Prudy Feeler) 1 MG tablet Take 1 mg by mouth See admin instructions. Take 1 mg by mouth at bedtime and an additional 1 mg up to two times a day as needed for anxiety    [provider]  aspirin EC 81 MG tablet Take 81 mg by mouth daily.    [provider]  ferrous sulfate 325 (65 FE) MG EC tablet Take 325 mg by mouth daily with breakfast.    [provider]  losartan (COZAAR) 100 MG tablet Take 100 mg by mouth daily.    [provider]    metFORMIN (GLUCOPHAGE) 500 MG tablet Take 500 mg by mouth 2 (two) times daily with a meal.    [provider]  naproxen (NAPROSYN) 500 MG tablet Take 500 mg by mouth 2 (two) times daily with a meal.    [provider]  nebivolol (BYSTOLIC) 10 MG tablet Take 10 mg by mouth daily.    [provider]  omeprazole (PRILOSEC) 20 MG capsule Take 20 mg by mouth daily before breakfast.    [provider]  oxyCODONE-acetaminophen (PERCOCET) 10-325 MG tablet Take 1 tablet by mouth See admin instructions. Take 1 tablet by mouth every four to six hours    [provider]  pravastatin (PRAVACHOL) 40 MG tablet Take 40 mg by mouth daily after lunch.    [provider]  vitamin B-12 (CYANOCOBALAMIN) 1000 MCG tablet Take 1,000 mcg by mouth daily.    [provider]    Family History History reviewed. No pertinent family history.  Social History Social History   Tobacco Use  . Smoking status: Current Every Day Smoker    Packs/day: 1.00    Types: Cigarettes  . Smokeless tobacco: Never Used  Substance Use Topics  . Alcohol use: Never    Frequency: Never  . Drug use: Not on file     Allergies   Patient has no known allergies.   Review of Systems Review of Systems  Constitutional: Negative for chills and fever.  Eyes: Negative for  visual disturbance.  Respiratory: Negative for shortness of breath.   Cardiovascular: Negative for chest pain.  Gastrointestinal: Positive for nausea and vomiting. Negative for abdominal pain.  Musculoskeletal: Negative for neck stiffness.  Neurological: Positive for headaches. Negative for dizziness, tremors, syncope, facial asymmetry, speech difficulty, weakness and numbness.  Psychiatric/Behavioral: Negative for behavioral problems and confusion.  All other systems reviewed and are negative.    Physical Exam Updated Vital Signs BP (!) 141/88 (BP Location: Right Arm)   Pulse (!) 59   Temp 98.3 F  (36.8 C) (Oral)   Ht 5\' 8"  (1.727 m)   Wt 81.6 kg   SpO2 99%   BMI 27.35 kg/m   Physical Exam  Constitutional: She appears well-developed and well-nourished.  Non-toxic appearance. She appears distressed (patient appears mildly uncomfortable).  HENT:  Head: Normocephalic and atraumatic.  Eyes: Pupils are equal, round, and reactive to light. Conjunctivae and EOM are normal. Right eye exhibits no discharge. Left eye exhibits no discharge.  Neck: Neck supple. No neck rigidity. No edema and no erythema present.  Cardiovascular: Regular rhythm. Bradycardia present.  Pulmonary/Chest: Effort normal and breath sounds normal. No respiratory distress. She has no wheezes. She has no rhonchi. She has no rales.  Respiration even and unlabored  Abdominal: Soft. Bowel sounds are normal. She exhibits no distension. There is no tenderness. There is no rigidity, no rebound and no guarding.  Neurological:  Alert. Clear speech. No facial droop. CNIII-XII grossly intact. Bilateral upper and lower extremities' sensation grossly intact. 5/5 symmetric strength with grip strength and with plantar and dorsi flexion bilaterally. Normal finger to nose bilaterally. Negative pronator drift. Negative Romberg sign. Gait is intact.    Skin: Skin is warm and dry. No rash noted.  Psychiatric: She has a normal mood and affect. Her behavior is normal.  Nursing note and vitals reviewed.   ED Treatments / Results  Labs (all labs ordered are listed, but only abnormal results are displayed) Labs Reviewed  COMPREHENSIVE METABOLIC PANEL - Abnormal; Notable for the following components:      Result Value   CO2 21 (*)    Glucose, Bld 113 (*)    Creatinine, Ser 1.74 (*)    GFR calc non Af Amer 28 (*)    GFR calc Af Amer 32 (*)    All other components within normal limits  CBC WITH DIFFERENTIAL/PLATELET - Abnormal; Notable for the following components:   Neutro Abs 7.9 (*)    All other components within normal limits  CBG  MONITORING, ED - Abnormal; Notable for the following components:   Glucose-Capillary 117 (*)    All other components within normal limits  LIPASE, BLOOD    EKG None  Radiology Ct Head Wo Contrast  Result Date: 06/12/2018 CLINICAL DATA:  Headache and nausea since this morning. EXAM: CT HEAD WITHOUT CONTRAST TECHNIQUE: Contiguous axial images were obtained from the base of the skull through the vertex without intravenous contrast. COMPARISON:  Brain MRI and head CT, 05/26/2018. FINDINGS: Brain: No evidence of acute infarction, hemorrhage, hydrocephalus, extra-axial collection or mass lesion/mass effect. Mild periventricular white matter hypoattenuation is noted consistent with chronic microvascular ischemic change. This is stable. Vascular: No hyperdense vessel or unexpected calcification. Skull: Normal. Negative for fracture or focal lesion. Sinuses/Orbits: Globes and orbits are unremarkable. Visualized sinuses and mastoid air cells are clear. Other: None. IMPRESSION: 1. No acute intracranial abnormalities. 2. Mild chronic microvascular ischemic change. Stable appearance from the prior exams. Electronically Signed   By: Onalee Hua  Ormond M.D.   On: 06/12/2018 12:54    Procedures Procedures (including critical care time)  Medications Ordered in ED Medications  sodium chloride 0.9 % bolus 500 mL (0 mLs Intravenous Stopped 06/12/18 1157)  morphine 4 MG/ML injection 4 mg (4 mg Intravenous Given 06/12/18 1057)  ondansetron (ZOFRAN) injection 4 mg (4 mg Intravenous Given 06/12/18 1056)  acetaminophen (TYLENOL) tablet 650 mg (650 mg Oral Given 06/12/18 1333)     Initial Impression / Assessment and Plan / ED Course  I have reviewed the triage vital signs and the nursing notes.  Pertinent labs & imaging results that were available during my care of the patient were reviewed by me and considered in my medical decision making (see chart for details).    Patient presents with complaint of headache.  Patient is nontoxic appearing, mild bradycardia and intermittently elevated BP. Patient has a benign physical exam- no focal neuro deficits, fever, or nuchal rigidity. Will evaluate with head CT and check abdominal labs given associated N/V- her abdomen is without peritoneal signs do not suspect acute surgical abdomen as underlying etiology. Analgesics and anti-emetics ordered.   Patient head CT negative for acute abnormality- no evidence of acute hemorrhage. Patient had MRI following onset of headache at her last ER visit that also showed no acute findings. Feel that ischemic/hemorrhagic CVA or SAH are unlikely. No fever or nuchal rigidity to raise concern for meningitis. Given bilateral without visual changes doubt acute glaucoma or giant cell arteritis. Imaging without mass. Her labs appear at baseline without concerning abnormalities- no significant electrolyte disturbance, renal function is baseline, LFTs/lipase WNL. Patient woken from sleep on re-evaluations, she is feeling better and tolerating PO- feels ready for discharge. Will discharge home with PCP follow up. I discussed results, treatment plan, need for PCP follow-up, and return precautions with the patient and her daughter at bedside. Provided opportunity for questions, patient and her daughter confirmed understanding and are in agreement with plan.   Findings and plan of care discussed with supervising physician Dr. Lynelle Doctor who evaluated patient and is in agreement.    Final Clinical Impressions(s) / ED Diagnoses   Final diagnoses:  Nonintractable headache, unspecified chronicity pattern, unspecified headache type    ED Discharge Orders    None       Cherly Anderson, PA-C 06/12/18 1407    Linwood Dibbles, MD 06/13/18 [phone]

## 2018-06-12 NOTE — ED Notes (Signed)
Patient verbalizes understanding of discharge instructions. Opportunity for questioning and answers were provided. Armband removed by staff, pt discharged from ED ambulatory.   

## 2018-06-25 ENCOUNTER — Other Ambulatory Visit: Payer: Self-pay

## 2018-06-25 ENCOUNTER — Encounter: Payer: Self-pay | Admitting: Neurology

## 2018-06-25 ENCOUNTER — Encounter

## 2018-06-25 ENCOUNTER — Ambulatory Visit (INDEPENDENT_AMBULATORY_CARE_PROVIDER_SITE_OTHER): Payer: Medicare HMO | Admitting: Neurology

## 2018-06-25 DIAGNOSIS — G4489 Other headache syndrome: Secondary | ICD-10-CM

## 2018-06-25 DIAGNOSIS — G454 Transient global amnesia: Secondary | ICD-10-CM

## 2018-06-25 HISTORY — DX: Transient global amnesia: G45.4

## 2018-06-25 HISTORY — DX: Other headache syndrome: G44.89

## 2018-06-25 MED ORDER — TOPIRAMATE 25 MG PO TABS: ORAL_TABLET | ORAL | 3 refills | Status: DC

## 2018-06-25 NOTE — Progress Notes (Signed)
Reason for visit: Transient global amnesia  Referring physician: Glen Fitzgerald  Joy Fitzgerald is a 72 y.o. female  History of present illness:  Ms. Joy Fitzgerald is a 72 year old right-handed white female with a history of diabetes and hypertension.  The patient indicates that in early October since she fell off of a porch and struck the back of her head.  The patient did not have any headaches initially.  On 22 October she went to the emergency room with an episode felt to be secondary to transient global amnesia.  The patient recalls feeling somewhat agitated that began around 2 PM that day.  She was having difficulty figuring out how to turn on the light above her stove.  She tried to check her sugar but was having difficulty doing this.  The patient then has little recollection of events that occurred over the next 3 to 4 hours.  The patient went to the emergency room, she eventually cleared up with her memory but she then had a headache.  She does not recall having a headache prior to the onset of the event.  The patient underwent a MRI of the brain that was completely normal.  She has continued to have daily headaches since that time, she went to the emergency room on 12 June 2018 with severe headache.  A CT scan of the brain was done and again was unremarkable.  The patient has been taking Tylenol for the pain.  The headache has gradually improved some but is still daily in nature, better in the morning and worse as the day goes on.  The patient has headaches in the back of the head and sometimes in the front or on the left temporal area.  She denies any numbness or weakness of the face, arms, legs.  She denies photophobia or phonophobia or nausea or vomiting.  She has not had any vision changes.  She does have chronic neck pain, she takes oxycodone for this occasionally.  She is sent to this office for further evaluation.  Past Medical History:  Diagnosis Date  . Diabetes mellitus without  complication (HCC)   . Hypertension   . Neck pain     No past surgical history on file.  No family history on file.  Social history:  reports that she has been smoking cigarettes. She has been smoking about 1.00 pack per day. She has never used smokeless tobacco. She reports that she does not drink alcohol. Her drug history is not on file.  Medications:  Prior to Admission medications   Medication Sig Start Date End Date Taking? Authorizing Provider  ALPRAZolam Prudy Feeler(XANAX) 1 MG tablet Take 1 mg by mouth See admin instructions. Take 1 mg by mouth at bedtime and an additional 1 mg up to two times a day as needed for anxiety   Yes [provider]  aspirin EC 81 MG tablet Take 81 mg by mouth daily.   Yes [provider]  ferrous sulfate 325 (65 FE) MG EC tablet Take 325 mg by mouth daily with breakfast.   Yes [provider]  losartan (COZAAR) 100 MG tablet Take 100 mg by mouth daily.   Yes [provider]  metFORMIN (GLUCOPHAGE) 500 MG tablet Take 500 mg by mouth 2 (two) times daily with a meal.   Yes [provider]  naproxen (NAPROSYN) 500 MG tablet Take 500 mg by mouth 2 (two) times daily with a meal.   Yes [provider]  nebivolol (  BYSTOLIC) 10 MG tablet Take 10 mg by mouth daily.   Yes [provider]  omeprazole (PRILOSEC) 20 MG capsule Take 20 mg by mouth daily before breakfast.   Yes [provider]  oxyCODONE-acetaminophen (PERCOCET) 10-325 MG tablet Take 1 tablet by mouth See admin instructions. Take 1 tablet by mouth every four to six hours   Yes [provider]  pravastatin (PRAVACHOL) 40 MG tablet Take 40 mg by mouth daily after lunch.   Yes [provider]  vitamin B-12 (CYANOCOBALAMIN) 1000 MCG tablet Take 1,000 mcg by mouth daily.   Yes [provider]     No Known Allergies  ROS:  Out of a complete 14 system review of symptoms, the patient complains only of the following  symptoms, and all other reviewed systems are negative.  Weight loss, fatigue Memory loss  Blood pressure (!) 175/77, pulse (!) 49, resp. rate 20, height 5\' 8"  (1.727 m), weight 172 lb (78 kg).  Physical Exam  General: The patient is alert and cooperative at the time of the examination.  Eyes: Pupils are equal, round, and reactive to light. Discs are flat bilaterally.  Neck: The neck is supple, no carotid bruits are noted.  Respiratory: The respiratory examination is clear.  Cardiovascular: The cardiovascular examination reveals a regular rate and rhythm, no obvious murmurs or rubs are noted.  Skin: Extremities are without significant edema.  Neurologic Exam  Mental status: The patient is alert and oriented x 3 at the time of the examination. The patient has apparent normal recent and remote memory, with an apparently normal attention span and concentration ability.  Mini-Mental status examination done today shows a total score of 29/30.  The patient is able to name 12 four-legged animals in 1 minute.  Cranial nerves: Facial symmetry is present. There is good sensation of the face to pinprick and soft touch bilaterally. The strength of the facial muscles and the muscles to head turning and shoulder shrug are normal bilaterally. Speech is well enunciated, no aphasia or dysarthria is noted. Extraocular movements are full. Visual fields are full. The tongue is midline, and the patient has symmetric elevation of the soft palate. No obvious hearing deficits are noted.  Motor: The motor testing reveals 5 over 5 strength of all 4 extremities. Good symmetric motor tone is noted throughout.  Sensory: Sensory testing is intact to pinprick, soft touch, vibration sensation, and position sense on all 4 extremities. No evidence of extinction is noted.  Coordination: Cerebellar testing reveals good finger-nose-finger and heel-to-shin bilaterally.  Gait and station: Gait is slightly unsteady.  The  patient has medial deviation of both knees, discomfort with walking.  Tandem gait is unsteady.  Romberg is negative. No drift is seen.  Reflexes: Deep tendon reflexes are symmetric and normal bilaterally. Toes are downgoing bilaterally.   MRI brain 06/25/18:  IMPRESSION: No acute finding. Mild to moderate chronic small-vessel ischemic changes of the pons and the cerebral hemispheric white matter.  * MRI scan images were reviewed online. I agree with the written report.    Assessment/Plan:  1.  Transient global amnesia  2.  Persistent headache  Certainly, migraine headache is a potential cause for transient global amnesia, but the patient has never had a history of migraine previously.  MRI of the brain has been completely normal, we will check a sedimentation rate and C-reactive protein today.  The patient will be placed on Topamax, she will follow-up in 3 months, she will call for any  dose adjustments of the medication.  An EEG study will be done.  Marlan Palau MD 06/25/2018 1:35 PM  [facility] [address] [place], [place] [address]  Phone [phone] Fax [phone]

## 2018-06-25 NOTE — Patient Instructions (Signed)
We will start Topamax for the headache.   Topamax (topiramate) is a seizure medication that has an FDA approval for seizures and for migraine headache. Potential side effects of this medication include weight loss, cognitive slowing, tingling in the fingers and toes, and carbonated drinks will taste bad. If any significant side effects are noted on this drug, please contact our office.  

## 2018-06-26 LAB — C-REACTIVE PROTEIN: CRP: <1 mg/L (ref 0–10)

## 2018-06-26 LAB — SEDIMENTATION RATE: Sed Rate: 6 mm/hr (ref 0–40)

## 2018-06-29 ENCOUNTER — Telehealth: Payer: Self-pay | Admitting: *Deleted

## 2018-06-29 NOTE — Telephone Encounter (Signed)
Attempted to contact pt. with lab results; received message that "this call can not be completed because there are restrictions on this line."/fim

## 2018-06-29 NOTE — Telephone Encounter (Signed)
-----   Message from York Spanielharles K Willis, MD sent at 06/26/2018  7:30 AM EST -----   The blood work results are unremarkable. Please call the patient.  ----- Message ----- From: Nell RangeInterface, [facility] Lab Results In Sent: 06/26/2018   5:39 AM EST To: York Spanielharles K Willis, MD

## 2018-07-24 ENCOUNTER — Ambulatory Visit: Payer: Medicare HMO | Admitting: Neurology

## 2018-07-30 ENCOUNTER — Ambulatory Visit (INDEPENDENT_AMBULATORY_CARE_PROVIDER_SITE_OTHER): Payer: Medicare HMO

## 2018-07-30 DIAGNOSIS — R410 Disorientation, unspecified: Secondary | ICD-10-CM | POA: Diagnosis not present

## 2018-07-30 DIAGNOSIS — G454 Transient global amnesia: Secondary | ICD-10-CM

## 2018-08-03 ENCOUNTER — Telehealth: Payer: Self-pay | Admitting: Neurology

## 2018-08-03 NOTE — Telephone Encounter (Signed)
I tried to call the patient, unable to contact her, apparently some restrictions on this telephone number, EEG study was normal.  The patient apparently has stopped the Topamax, could not tolerate this, will consider adding Depakote for the headaches.

## 2018-08-03 NOTE — Procedures (Signed)
    History:  Joy DakinReta Fitzgerald is a 72 year old patient with a history of transient confusion and subsequent amnesia.  The patient is being evaluated for this event that occurred on 12 June 2018.  This is a routine EEG.  No skull defects are noted.  Medications include alprazolam, aspirin, Cozaar, Glucophage, naproxen, Bystolic, Prilosec, oxycodone, Pravachol, and vitamin B12.  EEG classification: Normal awake  Description of the recording: The background rhythms of this recording consists of a fairly well modulated medium amplitude alpha rhythm of 10 Hz that is reactive to eye opening and closure. As the record progresses, the patient appears to remain in the waking state throughout the recording. Photic stimulation was performed, resulting in a bilateral and symmetric photic driving response. Hyperventilation was also performed, resulting in a minimal buildup of the background rhythm activities without significant slowing seen. At no time during the recording does there appear to be evidence of spike or spike wave discharges or evidence of focal slowing. EKG monitor shows no evidence of cardiac rhythm abnormalities with a heart rate of 48.  Impression: This is a normal EEG recording in the waking state. No evidence of ictal or interictal discharges are seen.  Bradycardia was noted during the study.

## 2018-08-06 ENCOUNTER — Telehealth: Payer: Self-pay

## 2018-08-06 NOTE — Telephone Encounter (Signed)
Lvm requesting a call back to discuss EEG results. MB RN

## 2018-08-06 NOTE — Telephone Encounter (Signed)
Lvm requesting a call back to discuss EEG results. MB RN 

## 2018-08-06 NOTE — Telephone Encounter (Signed)
Received a message from the phone room stating a return call from [phone] was received advising Ms. Convery was longer using this number.   After reviewing DPR,  number to call and leave vm was listed as [phone]. I contacted this number and was connected with the pt.   I was able to discuss EEG results. Pt declines starting Depakote at this time. Reports headaches have been better and would contact our office if she decides to start Depakote.  I confirmed the 336 [phone] number is the preferred contact and removed the [phone].   Pt had no further questions/concerns at this time MB RN

## 2018-08-06 NOTE — Telephone Encounter (Signed)
Error

## 2018-09-24 ENCOUNTER — Ambulatory Visit: Payer: Medicare HMO | Admitting: Neurology

## 2018-10-16 ENCOUNTER — Ambulatory Visit (INDEPENDENT_AMBULATORY_CARE_PROVIDER_SITE_OTHER): Payer: Medicare HMO | Admitting: Neurology

## 2018-10-16 ENCOUNTER — Encounter: Payer: Self-pay | Admitting: Neurology

## 2018-10-16 ENCOUNTER — Other Ambulatory Visit: Payer: Self-pay

## 2018-10-16 VITALS — BP 182/81 | HR 47 | Ht 68.0 in | Wt 177.3 lb

## 2018-10-16 DIAGNOSIS — G4489 Other headache syndrome: Secondary | ICD-10-CM

## 2018-10-16 DIAGNOSIS — G454 Transient global amnesia: Secondary | ICD-10-CM

## 2018-10-16 NOTE — Progress Notes (Signed)
Reason for visit: Transient global amnesia, headache  Joy Fitzgerald is an 73 y.o. 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  The patient began having headaches following this, she was placed on Topamax and seemed to improve with this.  The patient has noted that when she goes to smoke a cigarette, she will start getting a headache again, and she has subsequently quit smoking completely.  She has reduced or quit alcohol intake.  She is planning on having bilateral cataract surgery in the near future, and eventually in the summer have bilateral knee surgery for degenerative arthritis.  She is doing quite well, she is completely asymptomatic at this time.  She remains on low-dose aspirin.  She has stopped the Topamax.  Past Medical History:  Diagnosis Date  . Diabetes mellitus without complication (HCC)   . Headache syndrome 06/25/2018  . Hypertension   . Neck pain   . 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 No pertinent surgical history.  History reviewed. No pertinent family history.  Social history:  reports that she has been smoking cigarettes. She has been smoking about 1.00 pack per day. She has never used smokeless tobacco. She reports that she does not drink alcohol. No history on file for drug.   No Known Allergies  Medications:  Prior to Admission medications   Medication Sig Start Date End Date Taking? Authorizing Provider  ALPRAZolam Prudy Feeler) 1 MG tablet Take 1 mg by mouth See admin instructions. Take 1 mg by mouth at bedtime and an additional 1 mg up to two times a day as needed for anxiety   Yes [provider]  aspirin EC 81 MG tablet Take 81 mg by mouth daily.   Yes [provider]  ferrous sulfate 325 (65 FE) MG EC tablet Take 325 mg by mouth daily with breakfast.   Yes [provider]   losartan (COZAAR) 100 MG tablet Take 100 mg by mouth daily.   Yes [provider]  metFORMIN (GLUCOPHAGE) 500 MG tablet Take 500 mg by mouth 2 (two) times daily with a meal.   Yes [provider]  Multiple Vitamins-Minerals (MULTIVITAMIN ADULT) CHEW Chew by mouth.   Yes [provider]  naproxen (NAPROSYN) 500 MG tablet Take 500 mg by mouth 2 (two) times daily with a meal.   Yes [provider]  nebivolol (BYSTOLIC) 10 MG tablet Take 10 mg by mouth daily.   Yes [provider]  omeprazole (PRILOSEC) 20 MG capsule Take 20 mg by mouth daily before breakfast.   Yes [provider]  oxyCODONE-acetaminophen (PERCOCET) 10-325 MG tablet Take 1 tablet by mouth See admin instructions. Take 1 tablet by mouth every four to six hours   Yes [provider]  pravastatin (PRAVACHOL) 40 MG tablet Take 40 mg by mouth daily after lunch.   Yes [provider]  vitamin B-12 (CYANOCOBALAMIN) 1000 MCG tablet Take 1,000 mcg by mouth daily.   Yes [provider]    ROS:  Out of a complete 14 system review of symptoms, the patient complains only of the following symptoms, and all other reviewed systems are negative.  History of headache Joint pain  Blood pressure (!) 182/81, pulse (!) 47, height 5\' 8"  (1.727 m), weight 177 lb 5 oz (80.4 kg).  Physical Exam  General: The patient is alert and  cooperative at the time of the examination.  Skin: No significant peripheral edema is noted.   Neurologic Exam  Mental status: The patient is alert and oriented x 3 at the time of the examination. The patient has apparent normal recent and remote memory, with an apparently normal attention span and concentration ability.   Cranial nerves: Facial symmetry is present. Speech is normal, no aphasia or dysarthria is noted. Extraocular movements are full. Visual fields are full.  Motor: The patient has good strength in all 4 extremities.   Sensory examination: Soft touch sensation is symmetric on the face, arms, and legs.  Coordination: The patient has good finger-nose-finger and heel-to-shin bilaterally.  Gait and station: The patient has a normal gait. Tandem gait is unsteady. Romberg is negative. No drift is seen.  Reflexes: Deep tendon reflexes are symmetric.   Assessment/Plan:  1.  History of transient global amnesia  2.  History of headache, resolved  The patient will follow-up to this office on an as-needed basis, she has stopped the Topamax, she will remain on low-dose aspirin.  Greater than 50% of the visit was spent in counseling and coordination of care.  Face-to-face time with the patient was 15 minutes.   Marlan Palau MD 10/16/2018 11:48 AM  [facility] [address] [place], [place] [address]  Phone [phone] Fax [phone]

## 2020-03-29 ENCOUNTER — Other Ambulatory Visit: Payer: Self-pay

## 2020-03-29 ENCOUNTER — Encounter (INDEPENDENT_AMBULATORY_CARE_PROVIDER_SITE_OTHER): Payer: Self-pay | Admitting: Ophthalmology

## 2020-03-29 ENCOUNTER — Ambulatory Visit (INDEPENDENT_AMBULATORY_CARE_PROVIDER_SITE_OTHER): Payer: Medicare HMO | Admitting: Ophthalmology

## 2020-03-29 DIAGNOSIS — H43813 Vitreous degeneration, bilateral: Secondary | ICD-10-CM | POA: Diagnosis not present

## 2020-03-29 DIAGNOSIS — E113293 Type 2 diabetes mellitus with mild nonproliferative diabetic retinopathy without macular edema, bilateral: Secondary | ICD-10-CM | POA: Insufficient documentation

## 2020-03-29 DIAGNOSIS — E119 Type 2 diabetes mellitus without complications: Secondary | ICD-10-CM

## 2020-03-29 DIAGNOSIS — Z961 Presence of intraocular lens: Secondary | ICD-10-CM | POA: Diagnosis not present

## 2020-03-29 NOTE — Assessment & Plan Note (Signed)
The patient has diabetes without any evidence of retinopathy. The patient advised to maintain good blood glucose control, excellent blood pressure control, and favorable levels of cholesterol, low density lipoprotein, and high density lipoproteins. Follow up in 1 year was recommended. Explained that fluctuations in visual acuity , or "out of focus", may result from large variations of blood sugar control.

## 2020-03-29 NOTE — Patient Instructions (Signed)
Patient to report new onset visual acuity decline or distortion °

## 2020-03-29 NOTE — Progress Notes (Signed)
03/29/2020     CHIEF COMPLAINT Patient presents for Retina Follow Up   For history of diabetes mellitus without diabetic retinopathy  HISTORY OF PRESENT ILLNESS: Joy Fitzgerald is a 74 y.o. female who presents to the clinic today for:   HPI    Retina Follow Up    Patient presents with  Diabetic Retinopathy.  In both eyes.  This started 2 years ago.  Severity is mild.  Duration of 2 years.  Since onset it is stable.          Comments    2 Year Diabetic F/U OU  Pt sts VA OU has improved since CEIOL OU. Pt denies ocular pain, flashes of light, or floaters OU.  A1c: "5 something or 6", pt cannot recall when LBS: 100 "something" this AM        Last edited by Ileana Roup, COA on 03/29/2020  9:03 AM. (History)      Referring physician: [name], MD [address] [place],  [place] [address]  HISTORICAL INFORMATION:   Selected notes from the MEDICAL RECORD NUMBER       CURRENT MEDICATIONS: No current outpatient medications on file. (Ophthalmic Drugs)   No current facility-administered medications for this visit. (Ophthalmic Drugs)   Current Outpatient Medications (Other)  Medication Sig  . ALPRAZolam (XANAX) 1 MG tablet Take 1 mg by mouth See admin instructions. Take 1 mg by mouth at bedtime and an additional 1 mg up to two times a day as needed for anxiety  . aspirin EC 81 MG tablet Take 81 mg by mouth daily.  . ferrous sulfate 325 (65 FE) MG EC tablet Take 325 mg by mouth daily with breakfast.  . losartan (COZAAR) 100 MG tablet Take 100 mg by mouth daily.  . metFORMIN (GLUCOPHAGE) 500 MG tablet Take 500 mg by mouth 2 (two) times daily with a meal.  . Multiple Vitamins-Minerals (MULTIVITAMIN ADULT) CHEW Chew by mouth.  . naproxen (NAPROSYN) 500 MG tablet Take 500 mg by mouth 2 (two) times daily with a meal.  . nebivolol (BYSTOLIC) 10 MG tablet Take 10 mg by mouth daily.  Marland Kitchen omeprazole (PRILOSEC) 20 MG capsule Take 20 mg by mouth daily before breakfast.  .  oxyCODONE-acetaminophen (PERCOCET) 10-325 MG tablet Take 1 tablet by mouth See admin instructions. Take 1 tablet by mouth every four to six hours  . pravastatin (PRAVACHOL) 40 MG tablet Take 40 mg by mouth daily after lunch.  . vitamin B-12 (CYANOCOBALAMIN) 1000 MCG tablet Take 1,000 mcg by mouth daily.   No current facility-administered medications for this visit. (Other)      REVIEW OF SYSTEMS:    ALLERGIES Allergies  Allergen Reactions  . Codeine     PAST MEDICAL HISTORY Past Medical History:  Diagnosis Date  . Diabetes mellitus without complication (HCC)   . Headache syndrome 06/25/2018  . Hypertension   . Neck pain   . 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 No pertinent surgical history.  FAMILY HISTORY History reviewed. No pertinent family history.  SOCIAL HISTORY Social History   Tobacco Use  . Smoking status: Current Every Day Smoker    Packs/day: 1.00    Types: Cigarettes  . Smokeless tobacco: Never Used  Substance Use Topics  . Alcohol use: Never  . Drug use: Not on file         OPHTHALMIC EXAM:  Base Eye Exam    Visual Acuity (ETDRS)      Right  Left   Dist [place] 20/25 +2 20/30 -2   Dist ph [place]  20/25 +1       Tonometry (Tonopen, 9:04 AM)      Right Left   Pressure 20 22       Pupils      Pupils Dark Light Shape React APD   Right PERRL 4 3 Round Brisk None   Left PERRL 4 3 Round Brisk None       Visual Fields (Counting fingers)      Left Right    Full Full       Extraocular Movement      Right Left    Full Full       Neuro/Psych    Oriented x3: Yes   Mood/Affect: Normal       Dilation    Both eyes: 1.0% Mydriacyl, 2.5% Phenylephrine @ 9:07 AM        Slit Lamp and Fundus Exam    External Exam      Right Left   External Normal Normal       Slit Lamp Exam      Right Left   Lids/Lashes Normal Normal   Conjunctiva/Sclera White and quiet White and quiet   Cornea Clear Clear   Anterior Chamber  Deep and quiet Deep and quiet   Iris Round and reactive Round and reactive   Lens Posterior chamber intraocular lens Posterior chamber intraocular lens   Anterior Vitreous Normal Normal       Fundus Exam      Right Left   Posterior Vitreous Posterior vitreous detachment Posterior vitreous detachment   Disc Normal Normal   C/D Ratio 0.3 0.3   Macula Normal Normal   Vessels no DR no DR   Periphery Normal Normal          IMAGING AND PROCEDURES  Imaging and Procedures for 03/29/20  OCT, Retina - OU - Both Eyes       Right Eye Quality was good. Scan locations included subfoveal. Central Foveal Thickness: 264. Progression has been stable.   Left Eye Quality was good. Scan locations included subfoveal. Central Foveal Thickness: 268. Progression has been stable. Findings include normal foveal contour.   Notes Bilateral PVD                ASSESSMENT/PLAN:  Diabetes mellitus without complication (HCC) The patient has diabetes without any evidence of retinopathy. The patient advised to maintain good blood glucose control, excellent blood pressure control, and favorable levels of cholesterol, low density lipoprotein, and high density lipoproteins. Follow up in 1 year was recommended. Explained that fluctuations in visual acuity , or "out of focus", may result from large variations of blood sugar control.  Posterior vitreous detachment of both eyes   The nature of posterior vitreous detachment was discussed with the patient as well as its physiology, its age prevalence, and its possible implication regarding retinal breaks and detachment.  An informational brochure was given to the patient.  All the patient's questions were answered.  The patient was asked to return if new or different flashes or floaters develops.   Patient was instructed to contact office immediately if any changes were noticed. I explained to the patient that vitreous inside the eye is similar to jello inside  a bowl. As the jello melts it can start to pull away from the bowl, similarly the vitreous throughout our lives can begin to pull away from the retina. That process is called a  posterior vitreous detachment. In some cases, the vitreous can tug hard enough on the retina to form a retinal tear. I discussed with the patient the signs and symptoms of a retinal detachment.  Do not rub the eye.      ICD-10-CM   1. Diabetes mellitus without complication (HCC)  E11.9 OCT, Retina - OU - Both Eyes  2. Pseudophakia  Z96.1   3. Posterior vitreous detachment of both eyes  H43.813 OCT, Retina - OU - Both Eyes    1.  2.  3.  Ophthalmic Meds Ordered this visit:  No orders of the defined types were placed in this encounter.      Return in about 1 year (around 03/29/2021) for COLOR FP, OCT, DILATE OU.  Patient Instructions  Patient to report new onset visual acuity decline or distortion    Explained the diagnoses, plan, and follow up with the patient and they expressed understanding.  Patient expressed understanding of the importance of proper follow up care.   Alford Highland Audyn Dimercurio M.D. Diseases & Surgery of the Retina and Vitreous [facility] 03/29/20     Abbreviations: M myopia (nearsighted); A astigmatism; H hyperopia (farsighted); P presbyopia; Mrx spectacle prescription;  CTL contact lenses; OD right eye; OS left eye; OU both eyes  XT exotropia; ET esotropia; PEK punctate epithelial keratitis; PEE punctate epithelial erosions; DES dry eye syndrome; MGD meibomian gland dysfunction; ATs artificial tears; PFAT's preservative free artificial tears; NSC nuclear sclerotic cataract; PSC posterior subcapsular cataract; ERM epi-retinal membrane; PVD posterior vitreous detachment; RD retinal detachment; DM diabetes mellitus; DR diabetic retinopathy; NPDR non-proliferative diabetic retinopathy; PDR proliferative diabetic retinopathy; CSME clinically significant macular edema; DME diabetic  macular edema; dbh dot blot hemorrhages; CWS cotton wool spot; POAG primary open angle glaucoma; C/D cup-to-disc ratio; HVF humphrey visual field; GVF goldmann visual field; OCT optical coherence tomography; IOP intraocular pressure; BRVO Branch retinal vein occlusion; CRVO central retinal vein occlusion; CRAO central retinal artery occlusion; BRAO branch retinal artery occlusion; RT retinal tear; SB scleral buckle; PPV pars plana vitrectomy; VH Vitreous hemorrhage; PRP panretinal laser photocoagulation; IVK intravitreal kenalog; VMT vitreomacular traction; MH Macular hole;  NVD neovascularization of the disc; NVE neovascularization elsewhere; AREDS age related eye disease study; ARMD age related macular degeneration; POAG primary open angle glaucoma; EBMD epithelial/anterior basement membrane dystrophy; ACIOL anterior chamber intraocular lens; IOL intraocular lens; PCIOL posterior chamber intraocular lens; Phaco/IOL phacoemulsification with intraocular lens placement; PRK photorefractive keratectomy; LASIK laser assisted in situ keratomileusis; HTN hypertension; DM diabetes mellitus; COPD chronic obstructive pulmonary disease

## 2020-03-29 NOTE — Assessment & Plan Note (Signed)

## 2020-07-19 ENCOUNTER — Ambulatory Visit (INDEPENDENT_AMBULATORY_CARE_PROVIDER_SITE_OTHER): Payer: Medicare HMO

## 2020-07-19 ENCOUNTER — Ambulatory Visit (INDEPENDENT_AMBULATORY_CARE_PROVIDER_SITE_OTHER): Payer: Medicare HMO | Admitting: Orthopaedic Surgery

## 2020-07-19 VITALS — Ht 68.0 in | Wt 170.0 lb

## 2020-07-19 DIAGNOSIS — G8929 Other chronic pain: Secondary | ICD-10-CM | POA: Diagnosis not present

## 2020-07-19 DIAGNOSIS — M25561 Pain in right knee: Secondary | ICD-10-CM

## 2020-07-19 DIAGNOSIS — M170 Bilateral primary osteoarthritis of knee: Secondary | ICD-10-CM | POA: Diagnosis not present

## 2020-07-19 DIAGNOSIS — M1712 Unilateral primary osteoarthritis, left knee: Secondary | ICD-10-CM

## 2020-07-19 DIAGNOSIS — M1711 Unilateral primary osteoarthritis, right knee: Secondary | ICD-10-CM | POA: Insufficient documentation

## 2020-07-19 NOTE — Progress Notes (Signed)
Office Visit Note   Patient: Joy Fitzgerald           Date of Birth: 1946-04-05           MRN: [id number] Visit Date: 07/19/2020              Requested by: [name], MD [address] [place],  [place] [address] PCP: [name], MD   Assessment & Plan: Visit Diagnoses:  1. Chronic pain of right knee   2. Unilateral primary osteoarthritis, left knee   3. Unilateral primary osteoarthritis, right knee     Plan: Given the severity of her right knee pain combined with failure of conservative treatment and given the severity of her valgus malalignment and the x-ray showing severe end-stage arthritis, we are recommending a right total knee arthroplasty.  I explained in detail what the surgery involves.  I went over her x-rays with her.  I showed her knee model and described what knee replacement surgery involves from an intraoperative and postoperative standpoint.  I described in detail the risk and benefits of this type of surgery.  She is interested in having this scheduled at this point and I agree.  We will be in touch about scheduling surgery.  All questions and concerns were answered and addressed.  She knows to continue working on keeping her blood glucose under control.  Follow-Up Instructions: Return for 2 weeks post-op.   Orders:  Orders Placed This Encounter  Procedures  . XR KNEE 3 VIEW RIGHT   No orders of the defined types were placed in this encounter.     Procedures: No procedures performed   Clinical Data: No additional findings.   Subjective: Chief Complaint  Patient presents with  . Right Knee - Pain  The patient is a 74 year old female I am seeing for the first time for debilitating arthritis as it relates to both her knees with the right worse than left.  She has remote history of a right knee arthroscopy and a partial meniscectomy.  She ambulates with a cane.  She has been dealing with worsening hip pain for over 5 years now.  She is a former smoker  that is stopped.  She is a diabetic and reports a good hemoglobin A1c.  She fell on that knee lesser week ago and has gotten even more significantly painful to her.  She has been a fall risk for some time.  She is interested in knee replacement surgery.  The pain in both knees is 10 out of 10 with the right worse than left.  At this point her knee pain is detriment affecting her mobility, her quality of life and her actives daily living.  This is been going on for at least 5 years now and getting worse.  HPI  Review of Systems She currently denies any headache, chest pain, shortness of breath, fever, chills, nausea, vomiting  Objective: Vital Signs: Ht 5\' 8"  (1.727 m)   Wt 170 lb (77.1 kg)   BMI 25.85 kg/m   Physical Exam She is alert and oriented x3 and in no acute distress Ortho Exam She ambulates very slowly.  Her right and left knees both have valgus malalignment.  Her right knee is more swollen than the left with an effusion.  There is bruising around her right knee from a recent fall.  There is severe patellofemoral crepitation of both knees with flexion and extension.  The knees feel ligamentously lax. Specialty Comments:  No specialty comments available.  Imaging: XR KNEE 3 VIEW RIGHT  Result Date: 07/19/2020 3 views of the right knee showed no obvious fracture.  There is severe end-stage arthritis with valgus malalignment.  There is complete loss of lateral joint space    PMFS History: Patient Active Problem List   Diagnosis Date Noted  . Unilateral primary osteoarthritis, left knee 07/19/2020  . Unilateral primary osteoarthritis, right knee 07/19/2020  . Diabetes mellitus without complication (HCC) 03/29/2020  . Pseudophakia 03/29/2020  . Posterior vitreous detachment of both eyes 03/29/2020  . 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 Headache syndrome 06/25/2018   Past Medical History:  Diagnosis Date  . Diabetes mellitus without complication (HCC)   .  Headache syndrome 06/25/2018  . Hypertension   . Neck pain   . 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  No past surgical history on file. Social History   Occupational History  . Not on file  Tobacco Use  . Smoking status: Current Every Day Smoker    Packs/day: 1.00    Types: Cigarettes  . Smokeless tobacco: Never Used  Substance and Sexual Activity  . Alcohol use: Never  . Drug use: Not on file  . Sexual activity: Not on file

## 2020-09-28 ENCOUNTER — Other Ambulatory Visit: Payer: Self-pay

## 2020-10-06 ENCOUNTER — Other Ambulatory Visit: Payer: Self-pay | Admitting: Physician Assistant

## 2020-10-19 NOTE — Progress Notes (Signed)
Surgical Instructions    Your procedure is scheduled on 10/24/20.  Report to [facility] Main Entrance "A" at 12:05 A.M., then check in with the Admitting office.  Call this number if you have problems the morning of surgery:  [phone]   If you have any questions prior to your surgery date call [phone]: Open Monday-Friday 8am-4pm    Remember:  Do not eat after midnight the night before your surgery  You may drink clear liquids until 11:00  the morning of your surgery.   Clear liquids allowed are: Water, Non-Citrus Juices (without pulp), Carbonated Beverages, Clear Tea, Black Coffee Only, and Gatorade.  Drink sugar free  Drinks.  Please complete your bottle of water  that was provided to you by 11:00  the morning of surgery.  Please, if able, drink it in one setting. DO NOT SIP.    Take these medicines the morning of surgery with A SIP OF WATER: nebivolol (BYSTOLIC) omeprazole (PRILOSEC) pravastatin (PRAVACHOL)  IF NEEDED: ALPRAZolam (XANAX)  oxyCODONE-acetaminophen (PERCOCET)  As of today, STOP taking any Aspirin (unless otherwise instructed by your surgeon) Aleve, Naproxen, Ibuprofen, Motrin, Advil, Goody's, BC's, all herbal medications, fish oil, and all vitamins.                     Do not wear jewelry, make up, or nail polish            Do not wear lotions, powders, perfumes or deodorant.            Do not shave 48 hours prior to surgery.              Do not bring valuables to the hospital.            [facility] is not responsible for any belongings or valuables.  Do NOT Smoke (Tobacco/Vaping) or drink Alcohol 24 hours prior to your procedure If you use a CPAP at night, you may bring all equipment for your overnight stay.   Contacts, glasses, dentures or bridgework may not be worn into surgery, please bring cases for these belongings   For patients admitted to the hospital, discharge time will be determined by your treatment team.   Patients discharged the day  of surgery will not be allowed to drive home, and someone needs to stay with them for 24 hours.    Special instructions:   [place]- Preparing For Surgery  Before surgery, you can play an important role. Because skin is not sterile, your skin needs to be as free of germs as possible. You can reduce the number of germs on your skin by washing with CHG (chlorahexidine gluconate) Soap before surgery.  CHG is an antiseptic cleaner which kills germs and bonds with the skin to continue killing germs even after washing.    Oral Hygiene is also important to reduce your risk of infection.  Remember - BRUSH YOUR TEETH THE MORNING OF SURGERY WITH YOUR REGULAR TOOTHPASTE  Please do not use if you have an allergy to CHG or antibacterial soaps. If your skin becomes reddened/irritated stop using the CHG.  Do not shave (including legs and underarms) for at least 48 hours prior to first CHG shower. It is OK to shave your face.  Please follow these instructions carefully.   1. Shower the NIGHT BEFORE SURGERY and the MORNING OF SURGERY  2. If you chose to wash your hair, wash your hair first as usual with your normal shampoo.  3. After  you shampoo, rinse your hair and body thoroughly to remove the shampoo.  4. Wash Face and genitals (private parts) with your normal soap.   5.  Shower the NIGHT BEFORE SURGERY and the MORNING OF SURGERY with CHG Soap.   6. Use CHG Soap as you would any other liquid soap. You can apply CHG directly to the skin and wash gently with a scrungie or a clean washcloth.   7. Apply the CHG Soap to your body ONLY FROM THE NECK DOWN.  Do not use on open wounds or open sores. Avoid contact with your eyes, ears, mouth and genitals (private parts). Wash Face and genitals (private parts)  with your normal soap.   8. Wash thoroughly, paying special attention to the area where your surgery will be performed.  9. Thoroughly rinse your body with warm water from the neck down.  10. DO  NOT shower/wash with your normal soap after using and rinsing off the CHG Soap.  11. Pat yourself dry with a CLEAN TOWEL.  12. Wear CLEAN PAJAMAS to bed the night before surgery  13. Place CLEAN SHEETS on your bed the night before your surgery  14. DO NOT SLEEP WITH PETS.   Day of Surgery: Wear Clean/Comfortable clothing the morning of surgery Do not apply any deodorants/lotions.   Remember to brush your teeth WITH YOUR REGULAR TOOTHPASTE.   Please read over the following fact sheets that you were given.

## 2020-10-20 ENCOUNTER — Encounter (HOSPITAL_COMMUNITY): Payer: Self-pay

## 2020-10-20 ENCOUNTER — Other Ambulatory Visit: Payer: Self-pay

## 2020-10-20 ENCOUNTER — Encounter (HOSPITAL_COMMUNITY)
Admission: RE | Admit: 2020-10-20 | Discharge: 2020-10-20 | Disposition: A | Discharge status: Home / Self Care | Payer: Medicare HMO | Source: Ambulatory Visit | Attending: Orthopaedic Surgery | Admitting: Orthopaedic Surgery

## 2020-10-20 DIAGNOSIS — Z01818 Encounter for other preprocedural examination: Secondary | ICD-10-CM | POA: Diagnosis not present

## 2020-10-20 DIAGNOSIS — Z20822 Contact with and (suspected) exposure to covid-19: Secondary | ICD-10-CM | POA: Insufficient documentation

## 2020-10-20 HISTORY — DX: Dyspnea, unspecified: R06.00

## 2020-10-20 HISTORY — DX: Chronic kidney disease, unspecified: N18.9

## 2020-10-20 LAB — HEMOGLOBIN A1C
Hgb A1c MFr Bld: 6.3 % — ABNORMAL HIGH (ref 4.8–5.6)
Mean Plasma Glucose: 134.11 mg/dL

## 2020-10-20 LAB — CBC
HCT: 32.8 % — ABNORMAL LOW (ref 36.0–46.0)
Hemoglobin: 10.9 g/dL — ABNORMAL LOW (ref 12.0–15.0)
MCH: 31.3 pg (ref 26.0–34.0)
MCHC: 33.2 g/dL (ref 30.0–36.0)
MCV: 94.3 fL (ref 80.0–100.0)
Platelets: 258 10*3/uL (ref 150–400)
RBC: 3.48 MIL/uL — ABNORMAL LOW (ref 3.87–5.11)
RDW: 13.3 % (ref 11.5–15.5)
WBC: 6.7 10*3/uL (ref 4.0–10.5)
nRBC: 0 % (ref 0.0–0.2)

## 2020-10-20 LAB — BASIC METABOLIC PANEL
Anion gap: 9 (ref 5–15)
BUN: 25 mg/dL — ABNORMAL HIGH (ref 8–23)
CO2: 25 mmol/L (ref 22–32)
Calcium: 9.5 mg/dL (ref 8.9–10.3)
Chloride: 104 mmol/L (ref 98–111)
Creatinine, Ser: 1.92 mg/dL — ABNORMAL HIGH (ref 0.44–1.00)
GFR, Estimated: 27 mL/min — ABNORMAL LOW (ref >60)
Glucose, Bld: 150 mg/dL — ABNORMAL HIGH (ref 70–99)
Potassium: 4.5 mmol/L (ref 3.5–5.1)
Sodium: 138 mmol/L (ref 135–145)

## 2020-10-20 LAB — SURGICAL PCR SCREEN
MRSA, PCR: NEGATIVE
Staphylococcus aureus: NEGATIVE

## 2020-10-20 LAB — TYPE AND SCREEN
ABO/RH(D): O POS
Antibody Screen: NEGATIVE

## 2020-10-20 LAB — SARS CORONAVIRUS 2 (TAT 6-24 HRS): SARS Coronavirus 2: NEGATIVE

## 2020-10-20 LAB — GLUCOSE, CAPILLARY: Glucose-Capillary: 145 mg/dL — ABNORMAL HIGH (ref 70–99)

## 2020-10-20 NOTE — Progress Notes (Addendum)
PCP: [name], MD Cardiologist: denies  EKG: 10/20/20 CXR: 11/01/17 ECHO: denies Stress Test: denies Cardiac Cath: denies  Fasting Blood Sugar- 70-150 Checks Blood Sugar2___ times a day  OSA/CPAP: No  ASA:  Last dose 10/20/20 Blood Thinners: No  Covid test 10/20/20  Anesthesia Review: Yes, SB 40's  Patient denies shortness of breath, fever, cough, and chest pain at PAT appointment.  Patient verbalized understanding of instructions provided today at the PAT appointment.  Patient asked to review instructions at home and day of surgery.

## 2020-10-23 ENCOUNTER — Encounter (HOSPITAL_COMMUNITY): Payer: Self-pay

## 2020-10-23 MED ORDER — TRANEXAMIC ACID-NACL 1000-0.7 MG/100ML-% IV SOLN
1000.0000 mg | INTRAVENOUS | Status: AC
  Administered 2020-10-24: 1000 mg via INTRAVENOUS
  Filled 2020-10-23 (×2): qty 100

## 2020-10-23 NOTE — Anesthesia Preprocedure Evaluation (Addendum)
Anesthesia Evaluation  Patient identified by MRN, date of birth, ID band Patient awake    Reviewed: Allergy & Precautions, NPO status , Patient's Chart, lab work & pertinent test results, reviewed documented beta blocker date and time   History of Anesthesia Complications Negative for: history of anesthetic complications  Airway Mallampati: II  TM Distance: >3 FB Neck ROM: Full    Dental   Pulmonary neg pulmonary ROS, former smoker,    Pulmonary exam normal        Cardiovascular hypertension, Pt. on medications and Pt. on home beta blockers negative cardio ROS Normal cardiovascular exam     Neuro/Psych negative neurological ROS     GI/Hepatic Neg liver ROS, GERD  Medicated,  Endo/Other  diabetes, Type 2, Oral Hypoglycemic Agents  Renal/GU Renal InsufficiencyRenal disease (CKD)  negative genitourinary   Musculoskeletal negative musculoskeletal ROS (+)   Abdominal   Peds  Hematology  (+) anemia ,   Anesthesia Other Findings   Reproductive/Obstetrics                           Anesthesia Physical Anesthesia Plan  ASA: III  Anesthesia Plan: Spinal   Post-op Pain Management:  Regional for Post-op pain   Induction:   PONV Risk Score and Plan: 2 and Propofol infusion, Treatment may vary due to age or medical condition, Ondansetron and TIVA  Airway Management Planned: Nasal Cannula and Simple Face Mask  Additional Equipment: None  Intra-op Plan:   Post-operative Plan:   Informed Consent: I have reviewed the patients History and Physical, chart, labs and discussed the procedure including the risks, benefits and alternatives for the proposed anesthesia with the patient or authorized representative who has indicated his/her understanding and acceptance.       Plan Discussed with:   Anesthesia Plan Comments: (PAT note by Antionette Poles, PA-C:  Patient with asymptomatic bradycardia at  preadmission testing appointment.  Pulse rate of 50 on arrival.  Blood pressure 167/72.  EKG showed sinus bradycardia with a rate of 47.  Review of history shows no significant difference from 05/26/2018 tracing showing sinus bradycardia with a rate of 52.  Creatinine noted to be elevated on PAT labs, 1.92.  Review of previous labs available in epic shows patient's baseline creatinine to be ~1.85 going back to 2019.  Recent labs from PCP on 08/24/2020 also show creatinine 1.87.  Remainder of preop labs unremarkable, DM2 well-controlled with A1c 6.3.  EKG 10/20/2020: Sinus bradycardia.  Rate 47. )       Anesthesia Quick Evaluation

## 2020-10-23 NOTE — Progress Notes (Addendum)
Anesthesia Chart Review:  Patient with asymptomatic bradycardia at preadmission testing appointment.  Pulse rate of 50 on arrival.  Blood pressure 167/72.  EKG showed sinus bradycardia with a rate of 47.  Review of history shows no significant difference from 05/26/2018 tracing showing sinus bradycardia with a rate of 52.  Creatinine noted to be elevated on PAT labs, 1.92.  Review of previous labs available in epic shows patient's baseline creatinine to be ~1.85 going back to 2019.  Recent labs from PCP on 08/24/2020 also show creatinine 1.87.  Remainder of preop labs unremarkable, DM2 well-controlled with A1c 6.3.  EKG 10/20/2020: Sinus bradycardia.  Rate 47.    Zannie Cove [facility] Short Stay Center/Anesthesiology Phone [phone] 10/23/2020 3:13 PM

## 2020-10-24 ENCOUNTER — Other Ambulatory Visit: Payer: Self-pay

## 2020-10-24 ENCOUNTER — Encounter (HOSPITAL_COMMUNITY): Payer: Self-pay | Admitting: Orthopaedic Surgery

## 2020-10-24 ENCOUNTER — Observation Stay (HOSPITAL_COMMUNITY): Payer: Medicare HMO

## 2020-10-24 ENCOUNTER — Ambulatory Visit (HOSPITAL_COMMUNITY): Payer: Medicare HMO | Admitting: Physician Assistant

## 2020-10-24 ENCOUNTER — Ambulatory Visit (HOSPITAL_COMMUNITY): Payer: Medicare HMO | Admitting: Anesthesiology

## 2020-10-24 ENCOUNTER — Observation Stay (HOSPITAL_COMMUNITY)
Admission: RE | Admit: 2020-10-24 | Discharge: 2020-10-30 | Disposition: A | Discharge status: Home / Self Care | Payer: Medicare HMO | Attending: Orthopaedic Surgery | Admitting: Orthopaedic Surgery

## 2020-10-24 ENCOUNTER — Encounter (HOSPITAL_COMMUNITY)
Admission: RE | Disposition: A | Discharge status: Home / Self Care | Payer: Self-pay | Source: Home / Self Care | Attending: Orthopaedic Surgery

## 2020-10-24 DIAGNOSIS — Z96651 Presence of right artificial knee joint: Secondary | ICD-10-CM

## 2020-10-24 DIAGNOSIS — E1122 Type 2 diabetes mellitus with diabetic chronic kidney disease: Secondary | ICD-10-CM | POA: Diagnosis not present

## 2020-10-24 DIAGNOSIS — M1711 Unilateral primary osteoarthritis, right knee: Principal | ICD-10-CM | POA: Diagnosis present

## 2020-10-24 DIAGNOSIS — Z79899 Other long term (current) drug therapy: Secondary | ICD-10-CM | POA: Diagnosis not present

## 2020-10-24 DIAGNOSIS — N189 Chronic kidney disease, unspecified: Secondary | ICD-10-CM | POA: Insufficient documentation

## 2020-10-24 DIAGNOSIS — Z87891 Personal history of nicotine dependence: Secondary | ICD-10-CM | POA: Diagnosis not present

## 2020-10-24 DIAGNOSIS — I129 Hypertensive chronic kidney disease with stage 1 through stage 4 chronic kidney disease, or unspecified chronic kidney disease: Secondary | ICD-10-CM | POA: Diagnosis not present

## 2020-10-24 DIAGNOSIS — Z20822 Contact with and (suspected) exposure to covid-19: Secondary | ICD-10-CM | POA: Diagnosis not present

## 2020-10-24 DIAGNOSIS — M21061 Valgus deformity, not elsewhere classified, right knee: Secondary | ICD-10-CM | POA: Diagnosis not present

## 2020-10-24 HISTORY — PX: TOTAL KNEE ARTHROPLASTY: SHX125

## 2020-10-24 LAB — GLUCOSE, CAPILLARY
Glucose-Capillary: 96 mg/dL (ref 70–99)
Glucose-Capillary: 84 mg/dL (ref 70–99)
Glucose-Capillary: 86 mg/dL (ref 70–99)

## 2020-10-24 LAB — ABO/RH: ABO/RH(D): O POS

## 2020-10-24 SURGERY — ARTHROPLASTY, KNEE, TOTAL
Anesthesia: Spinal | Site: Knee | Laterality: Right

## 2020-10-24 MED ORDER — SODIUM CHLORIDE 0.9 % IR SOLN
Status: DC | PRN
  Administered 2020-10-24: 3000 mL

## 2020-10-24 MED ORDER — METFORMIN HCL 500 MG PO TABS: 500.0000 mg | ORAL_TABLET | Freq: Two times a day (BID) | ORAL | Status: DC

## 2020-10-24 MED ORDER — CHLORHEXIDINE GLUCONATE 0.12 % MT SOLN
OROMUCOSAL | Status: AC
  Administered 2020-10-24: 30 mL
  Filled 2020-10-24: qty 15

## 2020-10-24 MED ORDER — PROPOFOL 10 MG/ML IV BOLUS
INTRAVENOUS | Status: AC
  Filled 2020-10-24: qty 20

## 2020-10-24 MED ORDER — ACETAMINOPHEN 325 MG PO TABS
325.0000 mg | ORAL_TABLET | Freq: Four times a day (QID) | ORAL | Status: DC | PRN
  Administered 2020-10-28: 650 mg via ORAL
  Filled 2020-10-24: qty 2

## 2020-10-24 MED ORDER — FENTANYL CITRATE (PF) 100 MCG/2ML IJ SOLN
50.0000 ug | Freq: Once | INTRAMUSCULAR | Status: AC
  Filled 2020-10-24: qty 1

## 2020-10-24 MED ORDER — FENTANYL CITRATE (PF) 100 MCG/2ML IJ SOLN
INTRAMUSCULAR | Status: AC
  Filled 2020-10-24: qty 2

## 2020-10-24 MED ORDER — 0.9 % SODIUM CHLORIDE (POUR BTL) OPTIME
TOPICAL | Status: DC | PRN
  Administered 2020-10-24: 1000 mL

## 2020-10-24 MED ORDER — PHENOL 1.4 % MT LIQD: 1.0000 | OROMUCOSAL | Status: DC | PRN

## 2020-10-24 MED ORDER — LIDOCAINE HCL (CARDIAC) PF 100 MG/5ML IV SOSY
PREFILLED_SYRINGE | INTRAVENOUS | Status: DC | PRN
  Administered 2020-10-24: 10 mg via INTRAVENOUS

## 2020-10-24 MED ORDER — OXYCODONE HCL 5 MG PO TABS
10.0000 mg | ORAL_TABLET | ORAL | Status: DC | PRN
  Administered 2020-10-25: 10 mg via ORAL
  Administered 2020-10-25: 15 mg via ORAL
  Administered 2020-10-25: 10 mg via ORAL
  Administered 2020-10-25: 15 mg via ORAL
  Administered 2020-10-26: 10 mg via ORAL
  Administered 2020-10-28 – 2020-10-30 (×6): 15 mg via ORAL
  Filled 2020-10-24 (×3): qty 3
  Filled 2020-10-24 (×2): qty 2
  Filled 2020-10-24 (×4): qty 3
  Filled 2020-10-24: qty 2
  Filled 2020-10-24: qty 3

## 2020-10-24 MED ORDER — METOCLOPRAMIDE HCL 5 MG PO TABS: 5.0000 mg | ORAL_TABLET | Freq: Three times a day (TID) | ORAL | Status: DC | PRN

## 2020-10-24 MED ORDER — CEFAZOLIN SODIUM-DEXTROSE 1-4 GM/50ML-% IV SOLN
1.0000 g | Freq: Four times a day (QID) | INTRAVENOUS | Status: AC
  Administered 2020-10-24 – 2020-10-25 (×2): 1 g via INTRAVENOUS
  Filled 2020-10-24 (×2): qty 50

## 2020-10-24 MED ORDER — DOCUSATE SODIUM 100 MG PO CAPS
100.0000 mg | ORAL_CAPSULE | Freq: Two times a day (BID) | ORAL | Status: DC
  Administered 2020-10-24 – 2020-10-30 (×12): 100 mg via ORAL
  Filled 2020-10-24 (×12): qty 1

## 2020-10-24 MED ORDER — OXYCODONE HCL 5 MG PO TABS
5.0000 mg | ORAL_TABLET | ORAL | Status: DC | PRN
  Administered 2020-10-24 – 2020-10-26 (×2): 10 mg via ORAL
  Administered 2020-10-26: 5 mg via ORAL
  Filled 2020-10-24 (×3): qty 2

## 2020-10-24 MED ORDER — ALUM & MAG HYDROXIDE-SIMETH 200-200-20 MG/5ML PO SUSP
30.0000 mL | ORAL | Status: DC | PRN
Start: 2020-10-24 — End: 2020-10-30

## 2020-10-24 MED ORDER — PROPOFOL 500 MG/50ML IV EMUL
INTRAVENOUS | Status: DC | PRN
  Administered 2020-10-24: 75 ug/kg/min via INTRAVENOUS

## 2020-10-24 MED ORDER — ZOLPIDEM TARTRATE 5 MG PO TABS: 5.0000 mg | ORAL_TABLET | Freq: Every evening | ORAL | Status: DC | PRN

## 2020-10-24 MED ORDER — FENTANYL CITRATE (PF) 100 MCG/2ML IJ SOLN
INTRAMUSCULAR | Status: AC
  Administered 2020-10-24: 50 ug via INTRAVENOUS
  Filled 2020-10-24: qty 2

## 2020-10-24 MED ORDER — ONDANSETRON HCL 4 MG/2ML IJ SOLN: 4.0000 mg | Freq: Once | INTRAMUSCULAR | Status: DC | PRN

## 2020-10-24 MED ORDER — ONDANSETRON HCL 4 MG/2ML IJ SOLN
INTRAMUSCULAR | Status: DC | PRN
  Administered 2020-10-24: 4 mg via INTRAVENOUS

## 2020-10-24 MED ORDER — METHOCARBAMOL 500 MG PO TABS
ORAL_TABLET | ORAL | Status: AC
  Filled 2020-10-24: qty 1

## 2020-10-24 MED ORDER — ONDANSETRON HCL 4 MG PO TABS: 4.0000 mg | ORAL_TABLET | Freq: Four times a day (QID) | ORAL | Status: DC | PRN

## 2020-10-24 MED ORDER — METOCLOPRAMIDE HCL 5 MG/ML IJ SOLN: 5.0000 mg | Freq: Three times a day (TID) | INTRAMUSCULAR | Status: DC | PRN

## 2020-10-24 MED ORDER — PANTOPRAZOLE SODIUM 40 MG PO TBEC
40.0000 mg | DELAYED_RELEASE_TABLET | Freq: Every day | ORAL | Status: DC
  Administered 2020-10-24 – 2020-10-30 (×7): 40 mg via ORAL
  Filled 2020-10-24 (×7): qty 1

## 2020-10-24 MED ORDER — PHENYLEPHRINE HCL-NACL 10-0.9 MG/250ML-% IV SOLN
INTRAVENOUS | Status: DC | PRN
  Administered 2020-10-24: 25 ug/min via INTRAVENOUS

## 2020-10-24 MED ORDER — HYDROMORPHONE HCL 2 MG PO TABS
2.0000 mg | ORAL_TABLET | ORAL | Status: DC | PRN
  Administered 2020-10-25 – 2020-10-29 (×7): 2 mg via ORAL
  Filled 2020-10-24 (×7): qty 1

## 2020-10-24 MED ORDER — OXYCODONE HCL 5 MG PO TABS
ORAL_TABLET | ORAL | Status: AC
  Filled 2020-10-24: qty 1

## 2020-10-24 MED ORDER — PROPOFOL 10 MG/ML IV BOLUS
INTRAVENOUS | Status: DC | PRN
  Administered 2020-10-24: 10 mg via INTRAVENOUS
  Administered 2020-10-24: 20 mg via INTRAVENOUS
  Administered 2020-10-24: 10 mg via INTRAVENOUS

## 2020-10-24 MED ORDER — ONDANSETRON HCL 4 MG/2ML IJ SOLN: 4.0000 mg | Freq: Four times a day (QID) | INTRAMUSCULAR | Status: DC | PRN

## 2020-10-24 MED ORDER — MIDAZOLAM HCL 2 MG/2ML IJ SOLN
1.0000 mg | Freq: Once | INTRAMUSCULAR | Status: AC
  Filled 2020-10-24: qty 1

## 2020-10-24 MED ORDER — DIPHENHYDRAMINE HCL 12.5 MG/5ML PO ELIX: 12.5000 mg | ORAL_SOLUTION | ORAL | Status: DC | PRN

## 2020-10-24 MED ORDER — LACTATED RINGERS IV SOLN: INTRAVENOUS | Status: DC | PRN

## 2020-10-24 MED ORDER — HYDROMORPHONE HCL 1 MG/ML IJ SOLN
0.5000 mg | INTRAMUSCULAR | Status: DC | PRN
Start: 2020-10-24 — End: 2020-10-30
  Administered 2020-10-24 – 2020-10-29 (×5): 1 mg via INTRAVENOUS
  Filled 2020-10-24 (×5): qty 1

## 2020-10-24 MED ORDER — MENTHOL 3 MG MT LOZG: 1.0000 | LOZENGE | OROMUCOSAL | Status: DC | PRN

## 2020-10-24 MED ORDER — MIDAZOLAM HCL 2 MG/2ML IJ SOLN
INTRAMUSCULAR | Status: AC
  Administered 2020-10-24: 1 mg via INTRAVENOUS
  Filled 2020-10-24: qty 2

## 2020-10-24 MED ORDER — FENTANYL CITRATE (PF) 100 MCG/2ML IJ SOLN
25.0000 ug | INTRAMUSCULAR | Status: DC | PRN
  Administered 2020-10-24: 50 ug via INTRAVENOUS

## 2020-10-24 MED ORDER — OXYCODONE HCL 5 MG PO TABS
5.0000 mg | ORAL_TABLET | Freq: Once | ORAL | Status: AC | PRN
Start: 2020-10-24 — End: 2020-10-24
  Administered 2020-10-24: 5 mg via ORAL

## 2020-10-24 MED ORDER — ASPIRIN 81 MG PO CHEW
81.0000 mg | CHEWABLE_TABLET | Freq: Two times a day (BID) | ORAL | Status: DC
  Administered 2020-10-24 – 2020-10-30 (×12): 81 mg via ORAL
  Filled 2020-10-24 (×12): qty 1

## 2020-10-24 MED ORDER — SODIUM CHLORIDE 0.9 % IV SOLN: INTRAVENOUS | Status: DC

## 2020-10-24 MED ORDER — ROPIVACAINE HCL 5 MG/ML IJ SOLN
INTRAMUSCULAR | Status: DC | PRN
  Administered 2020-10-24: 30 mL via PERINEURAL

## 2020-10-24 MED ORDER — METHOCARBAMOL 1000 MG/10ML IJ SOLN
500.0000 mg | Freq: Four times a day (QID) | INTRAVENOUS | Status: DC | PRN
  Filled 2020-10-24: qty 5

## 2020-10-24 MED ORDER — METHOCARBAMOL 500 MG PO TABS
500.0000 mg | ORAL_TABLET | Freq: Four times a day (QID) | ORAL | Status: DC | PRN
  Administered 2020-10-24 – 2020-10-29 (×7): 500 mg via ORAL
  Filled 2020-10-24 (×7): qty 1

## 2020-10-24 MED ORDER — LOSARTAN POTASSIUM 50 MG PO TABS
100.0000 mg | ORAL_TABLET | Freq: Every day | ORAL | Status: DC
  Administered 2020-10-25 – 2020-10-30 (×6): 100 mg via ORAL
  Filled 2020-10-24 (×6): qty 2

## 2020-10-24 MED ORDER — PRAVASTATIN SODIUM 40 MG PO TABS
40.0000 mg | ORAL_TABLET | Freq: Every day | ORAL | Status: DC
  Administered 2020-10-25 – 2020-10-30 (×6): 40 mg via ORAL
  Filled 2020-10-24 (×6): qty 1

## 2020-10-24 MED ORDER — OXYCODONE HCL 5 MG/5ML PO SOLN: 5.0000 mg | Freq: Once | ORAL | Status: AC | PRN

## 2020-10-24 MED ORDER — NEBIVOLOL HCL 10 MG PO TABS
10.0000 mg | ORAL_TABLET | Freq: Every day | ORAL | Status: DC
  Administered 2020-10-25 – 2020-10-30 (×6): 10 mg via ORAL
  Filled 2020-10-24 (×6): qty 1

## 2020-10-24 MED ORDER — HYDRALAZINE HCL 20 MG/ML IJ SOLN
INTRAMUSCULAR | Status: AC
  Filled 2020-10-24: qty 1

## 2020-10-24 MED ORDER — ALPRAZOLAM 0.5 MG PO TABS
1.0000 mg | ORAL_TABLET | Freq: Every evening | ORAL | Status: DC | PRN
  Administered 2020-10-24 – 2020-10-29 (×5): 1 mg via ORAL
  Filled 2020-10-24 (×5): qty 2

## 2020-10-24 MED ORDER — CEFAZOLIN SODIUM-DEXTROSE 2-4 GM/100ML-% IV SOLN
2.0000 g | INTRAVENOUS | Status: AC
  Administered 2020-10-24: 2 g via INTRAVENOUS
  Filled 2020-10-24: qty 100

## 2020-10-24 MED ORDER — POLYETHYLENE GLYCOL 3350 17 G PO PACK: 17.0000 g | PACK | Freq: Every day | ORAL | Status: DC | PRN

## 2020-10-24 MED ORDER — HYDRALAZINE HCL 20 MG/ML IJ SOLN
10.0000 mg | Freq: Once | INTRAMUSCULAR | Status: AC
  Administered 2020-10-24: 10 mg via INTRAVENOUS

## 2020-10-24 MED ORDER — POVIDONE-IODINE 10 % EX SWAB: 2.0000 "application " | Freq: Once | CUTANEOUS | Status: DC

## 2020-10-24 SURGICAL SUPPLY — 62 items
BANDAGE ESMARK 6X9 LF (GAUZE/BANDAGES/DRESSINGS) ×1 IMPLANT
BASEPLATE TIBIAL PRIMARY SZ4 (Plate) ×1 IMPLANT
BLADE SAG 18X100X1.27 (BLADE) ×2 IMPLANT
BNDG CMPR 9X6 STRL LF SNTH (GAUZE/BANDAGES/DRESSINGS) ×1
BNDG ELASTIC 6X5.8 VLCR STR LF (GAUZE/BANDAGES/DRESSINGS) ×4 IMPLANT
BNDG ESMARK 6X9 LF (GAUZE/BANDAGES/DRESSINGS) ×2
BOWL SMART MIX CTS (DISPOSABLE) ×2 IMPLANT
BSPLAT TIB 4 CMNT PRM STRL KN (Plate) ×1 IMPLANT
CEMENT BONE SIMPLEX SPEEDSET (Cement) ×2 IMPLANT
COVER SURGICAL LIGHT HANDLE (MISCELLANEOUS) ×2 IMPLANT
COVER WAND RF STERILE (DRAPES) ×2 IMPLANT
CUFF TOURN SGL QUICK 34 (TOURNIQUET CUFF) ×2
CUFF TOURN SGL QUICK 42 (TOURNIQUET CUFF) ×1 IMPLANT
CUFF TRNQT CYL 34X4.125X (TOURNIQUET CUFF) ×1 IMPLANT
DRAPE EXTREMITY T 121X128X90 (DISPOSABLE) ×2 IMPLANT
DRAPE HALF SHEET 40X57 (DRAPES) ×2 IMPLANT
DRAPE U-SHAPE 47X51 STRL (DRAPES) ×2 IMPLANT
DRSG AQUACEL AG ADV 3.5X10 (GAUZE/BANDAGES/DRESSINGS) ×1 IMPLANT
DRSG PAD ABDOMINAL 8X10 ST (GAUZE/BANDAGES/DRESSINGS) ×2 IMPLANT
DURAPREP 26ML APPLICATOR (WOUND CARE) ×2 IMPLANT
ELECT CAUTERY BLADE 6.4 (BLADE) ×2 IMPLANT
ELECT REM PT RETURN 9FT ADLT (ELECTROSURGICAL) ×2
ELECTRODE REM PT RTRN 9FT ADLT (ELECTROSURGICAL) ×1 IMPLANT
FACESHIELD WRAPAROUND (MASK) ×4 IMPLANT
FACESHIELD WRAPAROUND OR TEAM (MASK) ×2 IMPLANT
FEMORAL PEG DISTAL FIXATION (Orthopedic Implant) ×1 IMPLANT
FEMORAL TRIATH POST STAB SZ 4 (Orthopedic Implant) ×1 IMPLANT
GAUZE SPONGE 4X4 12PLY STRL (GAUZE/BANDAGES/DRESSINGS) ×2 IMPLANT
GAUZE XEROFORM 1X8 LF (GAUZE/BANDAGES/DRESSINGS) ×2 IMPLANT
GLOVE ORTHO TXT STRL SZ7.5 (GLOVE) ×2 IMPLANT
GLOVE SRG 8 PF TXTR STRL LF DI (GLOVE) ×2 IMPLANT
GLOVE SURG ORTHO 8.0 STRL STRW (GLOVE) ×2 IMPLANT
GLOVE SURG UNDER POLY LF SZ8 (GLOVE) ×4
GOWN STRL REUS W/ TWL XL LVL3 (GOWN DISPOSABLE) ×2 IMPLANT
GOWN STRL REUS W/TWL XL LVL3 (GOWN DISPOSABLE) ×4
HANDPIECE INTERPULSE COAX TIP (DISPOSABLE) ×2
IMMOBILIZER KNEE 22 UNIV (SOFTGOODS) ×2 IMPLANT
INSERT TIBIAL TRIATHLON PS X3 (Insert) ×1 IMPLANT
KIT BASIN OR (CUSTOM PROCEDURE TRAY) ×2 IMPLANT
KIT TURNOVER KIT B (KITS) ×2 IMPLANT
MANIFOLD NEPTUNE II (INSTRUMENTS) ×2 IMPLANT
NS IRRIG 1000ML POUR BTL (IV SOLUTION) ×2 IMPLANT
PACK TOTAL JOINT (CUSTOM PROCEDURE TRAY) ×2 IMPLANT
PAD ARMBOARD 7.5X6 YLW CONV (MISCELLANEOUS) ×2 IMPLANT
PADDING CAST COTTON 6X4 STRL (CAST SUPPLIES) ×2 IMPLANT
PATELLA TRIATHLON SZ 29 9 MM (Orthopedic Implant) ×1 IMPLANT
PIN FLUTED HEDLESS FIX 3.5X1/8 (PIN) ×1 IMPLANT
SET HNDPC FAN SPRY TIP SCT (DISPOSABLE) ×1 IMPLANT
SET PAD KNEE POSITIONER (MISCELLANEOUS) ×2 IMPLANT
STAPLER VISISTAT 35W (STAPLE) ×1 IMPLANT
SUCTION FRAZIER HANDLE 10FR (MISCELLANEOUS) ×2
SUCTION TUBE FRAZIER 10FR DISP (MISCELLANEOUS) ×1 IMPLANT
SUT VIC AB 0 CT1 27 (SUTURE) ×2
SUT VIC AB 0 CT1 27XBRD ANBCTR (SUTURE) ×1 IMPLANT
SUT VIC AB 1 CT1 27 (SUTURE) ×8
SUT VIC AB 1 CT1 27XBRD ANBCTR (SUTURE) ×2 IMPLANT
SUT VIC AB 2-0 CT1 27 (SUTURE) ×6
SUT VIC AB 2-0 CT1 TAPERPNT 27 (SUTURE) ×2 IMPLANT
TOWEL GREEN STERILE (TOWEL DISPOSABLE) ×2 IMPLANT
TOWEL GREEN STERILE FF (TOWEL DISPOSABLE) ×2 IMPLANT
TRAY CATH 16FR W/PLASTIC CATH (SET/KITS/TRAYS/PACK) ×1 IMPLANT
WRAP KNEE MAXI GEL POST OP (GAUZE/BANDAGES/DRESSINGS) ×2 IMPLANT

## 2020-10-24 NOTE — Progress Notes (Signed)
PHARMACIST - PHYSICIAN COMMUNICATION  CONCERNING:  METFORMIN SAFE ADMINISTRATION POLICY  RECOMMENDATION: Metformin has been placed on DISCONTINUE (rejected order) STATUS and should be reordered only after any of the conditions below are ruled out.   DESCRIPTION:  The Pharmacy Committee has adopted a policy that restricts the use of metformin in hospitalized patients until all the contraindications to administration have been ruled out:  _0  eGFR below 30 mL/min/1.6m _1  Acute or chronic metabolic acidosis (including DKA) _2  Shock, acute MI, sepsis, hypoxemia, dehydration _3  Administration of intra-arterial iodinated contrast: impending, or completed within past 48 hours* _4  Administration of intravenous iodinated contrast: impending, or completed within past 48 hours, IF any of the following risk factors are also present**:  eGFR 30 - 60 mL/min/1.775m history of hepatic impairment, alcoholism, or heart failure  ** When metformin is stopped due to administration of iodinated contrast, recommend re-evaluating eGFR 48 hours after the imaging procedure and restarting metformin if renal function is stable and no other contraindications are present.  VeWynona NeatPharmD, BCPS  10/24/2020 10:40 PM

## 2020-10-24 NOTE — Anesthesia Postprocedure Evaluation (Signed)
Anesthesia Post Note  Patient: Joy Fitzgerald  Procedure(s) Performed: RIGHT TOTAL KNEE ARTHROPLASTY (Right Knee)     Patient location during evaluation: PACU Anesthesia Type: Spinal Level of consciousness: oriented and awake and alert Pain management: pain level controlled Vital Signs Assessment: post-procedure vital signs reviewed and stable Respiratory status: spontaneous breathing, respiratory function stable and nonlabored ventilation Cardiovascular status: blood pressure returned to baseline and stable Postop Assessment: no headache, no backache, no apparent nausea or vomiting and spinal receding Anesthetic complications: no   No complications documented.  Last Vitals:  Vitals:   10/24/20 1802 10/24/20 1815  BP: (!) 152/63 (!) 149/59  Pulse: (!) 52 (!) 54  Resp: 13 10  Temp:  (!) 36.1 C  SpO2: 99% 99%    Last Pain:  Vitals:   10/24/20 1815  TempSrc:   PainSc: 4     LLE Motor Response: Purposeful movement (10/24/20 1815) LLE Sensation: Full sensation (10/24/20 1815) RLE Motor Response: Purposeful movement (10/24/20 1815) RLE Sensation: Full sensation (10/24/20 1815) L Sensory Level: L5-Outer lower leg, top of foot, great toe (10/24/20 1815) R Sensory Level: L5-Outer lower leg, top of foot, great toe (10/24/20 1815)  Lucretia Kern

## 2020-10-24 NOTE — Transfer of Care (Signed)
Immediate Anesthesia Transfer of Care Note  Patient: Joy Fitzgerald  Procedure(s) Performed: RIGHT TOTAL KNEE ARTHROPLASTY (Right Knee)  Patient Location: PACU  Anesthesia Type:MAC combined with regional for post-op pain  Level of Consciousness: awake, alert , oriented and patient cooperative  Airway & Oxygen Therapy: Patient Spontanous Breathing  Post-op Assessment: Report given to RN and Post -op Vital signs reviewed and stable  Post vital signs: Reviewed and stable  Last Vitals:  Vitals Value Taken Time  BP 135/62 10/24/20 1631  Temp    Pulse 52 10/24/20 1635  Resp 11 10/24/20 1635  SpO2 97 % 10/24/20 1635  Vitals shown include unvalidated device data.  Last Pain:  Vitals:   10/24/20 1226  TempSrc:   PainSc: 8          Complications: No complications documented.

## 2020-10-24 NOTE — Anesthesia Procedure Notes (Signed)
Spinal  Patient location during procedure: OR Reason for block: surgical anesthesia Staffing Performed: anesthesiologist  Anesthesiologist: Neriyah Cercone E, MD Preanesthetic Checklist Completed: patient identified, IV checked, risks and benefits discussed, surgical consent, monitors and equipment checked, pre-op evaluation and timeout performed Spinal Block Patient position: sitting Prep: DuraPrep and site prepped and draped Patient monitoring: continuous pulse ox, blood pressure and heart rate Approach: midline Location: L3-4 Injection technique: single-shot Needle Needle type: Pencan  Needle gauge: 24 G Needle length: 9 cm Assessment Events: CSF return Additional Notes Functioning IV was confirmed and monitors were applied. Sterile prep and drape, including hand hygiene and sterile gloves were used. The patient was positioned and the spine was prepped. The skin was anesthetized with lidocaine.  Free flow of clear CSF was obtained prior to injecting local anesthetic into the CSF. The needle was carefully withdrawn. The patient tolerated the procedure well.     

## 2020-10-24 NOTE — H&P (Signed)
TOTAL KNEE ADMISSION H&P  Patient is being admitted for right total knee arthroplasty.  Subjective:  Chief Complaint:right knee pain.  HPI: Joy Fitzgerald, 75 y.o. female, has a history of pain and functional disability in the right knee due to arthritis and has failed non-surgical conservative treatments for greater than 12 weeks to includeNSAID's and/or analgesics, corticosteriod injections, viscosupplementation injections, flexibility and strengthening excercises, supervised PT with diminished ADL's post treatment, use of assistive devices and activity modification.  Onset of symptoms was gradual, starting 5 years ago with gradually worsening course since that time. The patient noted prior procedures on the knee to include  arthroscopy on the right knee(s).  Patient currently rates pain in the right knee(s) at 10 out of 10 with activity. Patient has night pain, worsening of pain with activity and weight bearing, pain that interferes with activities of daily living, pain with passive range of motion, crepitus and joint swelling.  Patient has evidence of subchondral sclerosis, periarticular osteophytes and joint space narrowing by imaging studies. There is no active infection.  Patient Active Problem List   Diagnosis Date Noted  . Unilateral primary osteoarthritis, left knee 07/19/2020  . Unilateral primary osteoarthritis, right knee 07/19/2020  . Diabetes mellitus without complication (HCC) 03/29/2020  . Pseudophakia 03/29/2020  . Posterior vitreous detachment of both eyes 03/29/2020  . 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 Headache syndrome 06/25/2018   Past Medical History:  Diagnosis Date  . CKD (chronic kidney disease)   . Diabetes mellitus without complication (HCC)   . Dyspnea   . Headache syndrome 06/25/2018  . Hypertension   . Neck pain   . 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 APPENDECTOMY    . EYE SURGERY       Current Facility-Administered Medications  Medication Dose Route Frequency Provider Last Rate Last Admin  . [START ON 10/25/2020] ceFAZolin (ANCEF) IVPB 2g/100 mL premix  2 g Intravenous On Call to OR Kirtland Bouchard, PA-C      . fentaNYL (SUBLIMAZE) 100 MCG/2ML injection           . fentaNYL (SUBLIMAZE) injection 50 mcg  50 mcg Intravenous Once Lucretia Kern, MD      . midazolam (VERSED) 2 MG/2ML injection           . midazolam (VERSED) injection 1 mg  1 mg Intravenous Once Lucretia Kern, MD      . povidone-iodine 10 % swab 2 application  2 application Topical Once Richardean Canal W, PA-C      . tranexamic acid (CYKLOKAPRON) IVPB 1,000 mg  1,000 mg Intravenous To OR Kathryne Hitch, MD       Allergies  Allergen Reactions  . Codeine Nausea And Vomiting    Social History   Tobacco Use  . Smoking status: Former Smoker    Packs/day: 1.00    Types: Cigarettes    Quit date: 10/20/2016    Years since quitting: 4.0  . Smokeless tobacco: Never Used  Substance Use Topics  . Alcohol use: Not Currently    History reviewed. No pertinent family history.   Review of Systems  Musculoskeletal: Positive for gait problem and joint swelling.  All other systems reviewed and are negative.   Objective:  Physical Exam Vitals reviewed.  Constitutional:      Appearance: Normal appearance.  HENT:     Head: Normocephalic and atraumatic.  Eyes:     Extraocular Movements:  Extraocular movements intact.  Cardiovascular:     Rate and Rhythm: Normal rate.     Pulses: Normal pulses.  Pulmonary:     Effort: Pulmonary effort is normal.  Abdominal:     Palpations: Abdomen is soft.  Musculoskeletal:     Cervical back: Normal range of motion.     Right knee: Effusion and bony tenderness present. Decreased range of motion. Tenderness present over the medial joint line, lateral joint line and patellar tendon. Abnormal alignment and abnormal meniscus.  Neurological:     Mental  Status: She is alert and oriented to person, place, and time.  Psychiatric:        Behavior: Behavior normal.     Vital signs in last 24 hours: Temp:  [98 F (36.7 C)] 98 F (36.7 C) (03/22 1218) Pulse Rate:  [53-60] 60 (03/22 1340) Resp:  [9-17] 12 (03/22 1340) BP: (170-202)/(61-71) 195/67 (03/22 1340) SpO2:  [98 %-100 %] 100 % (03/22 1340) Weight:  [76.5 kg] 76.5 kg (03/22 1218)  Labs:   Estimated body mass index is 25.64 kg/m as calculated from the following:   Height as of this encounter: 5\' 8"  (1.727 m).   Weight as of this encounter: 76.5 kg.   Imaging Review Plain radiographs demonstrate severe degenerative joint disease of the right knee(s). The overall alignment ismild valgus. The bone quality appears to be good for age and reported activity level.      Assessment/Plan:  End stage arthritis, right knee   The patient history, physical examination, clinical judgment of the provider and imaging studies are consistent with end stage degenerative joint disease of the right knee(s) and total knee arthroplasty is deemed medically necessary. The treatment options including medical management, injection therapy arthroscopy and arthroplasty were discussed at length. The risks and benefits of total knee arthroplasty were presented and reviewed. The risks due to aseptic loosening, infection, stiffness, patella tracking problems, thromboembolic complications and other imponderables were discussed. The patient acknowledged the explanation, agreed to proceed with the plan and consent was signed. Patient is being admitted for inpatient treatment for surgery, pain control, PT, OT, prophylactic antibiotics, VTE prophylaxis, progressive ambulation and ADL's and discharge planning. The patient is planning to be discharged home with home health services

## 2020-10-24 NOTE — Progress Notes (Signed)
Dr. Clemens Catholic aware of elveated BP's.

## 2020-10-24 NOTE — Brief Op Note (Signed)
10/24/2020  4:11 PM  PATIENT:  Jacqulene Weisenberger  75 y.o. female  PRE-OPERATIVE DIAGNOSIS:  Osteoarthritis Right Knee  POST-OPERATIVE DIAGNOSIS:  Osteoarthritis Right Knee  PROCEDURE:  Procedure(s): RIGHT TOTAL KNEE ARTHROPLASTY (Right)  SURGEON:  Surgeon(s) and Role:    Kathryne Hitch, MD - Primary  PHYSICIAN ASSISTANT:  Rexene Edison, PA-C  ANESTHESIA:   regional and spinal  EBL:  100 mL   COUNTS:  YES  TOURNIQUET:   Total Tourniquet Time Documented: Thigh (Right) - 55 minutes Total: Thigh (Right) - 55 minutes   DICTATION: .Other Dictation: Dictation Number [id number]  PLAN OF CARE: Admit for overnight observation  PATIENT DISPOSITION:  PACU - hemodynamically stable.   Delay start of Pharmacological VTE agent (>24hrs) due to surgical blood loss or risk of bleeding: no

## 2020-10-24 NOTE — Anesthesia Procedure Notes (Signed)
Anesthesia Regional Block: Adductor canal block   Pre-Anesthetic Checklist: ,, timeout performed, Correct Patient, Correct Site, Correct Laterality, Correct Procedure, Correct Position, site marked, Risks and benefits discussed,  Surgical consent,  Pre-op evaluation,  At surgeon's request and post-op pain management  Laterality: Right  Prep: chloraprep       Needles:  Injection technique: Single-shot  Needle Type: Echogenic Stimulator Needle     Needle Length: 10cm  Needle Gauge: 20     Additional Needles:   Procedures:,,,, ultrasound used (permanent image in chart),,,,  Narrative:  Start time: 10/24/2020 1:31 PM End time: 10/24/2020 1:36 PM Injection made incrementally with aspirations every 5 mL.  Performed by: Personally  Anesthesiologist: Lucretia Kern, MD  Additional Notes: Standard monitors applied. Skin prepped. Good needle visualization with ultrasound. Injection made in 5cc increments with no resistance to injection. Patient tolerated the procedure well.

## 2020-10-25 ENCOUNTER — Encounter (HOSPITAL_COMMUNITY): Payer: Self-pay | Admitting: Orthopaedic Surgery

## 2020-10-25 DIAGNOSIS — M1711 Unilateral primary osteoarthritis, right knee: Secondary | ICD-10-CM | POA: Diagnosis not present

## 2020-10-25 LAB — CBC
HCT: 27.5 % — ABNORMAL LOW (ref 36.0–46.0)
Hemoglobin: 9.2 g/dL — ABNORMAL LOW (ref 12.0–15.0)
MCH: 31.3 pg (ref 26.0–34.0)
MCHC: 33.5 g/dL (ref 30.0–36.0)
MCV: 93.5 fL (ref 80.0–100.0)
Platelets: 212 10*3/uL (ref 150–400)
RBC: 2.94 MIL/uL — ABNORMAL LOW (ref 3.87–5.11)
RDW: 13.4 % (ref 11.5–15.5)
WBC: 8.6 10*3/uL (ref 4.0–10.5)
nRBC: 0 % (ref 0.0–0.2)

## 2020-10-25 LAB — GLUCOSE, CAPILLARY: Glucose-Capillary: 154 mg/dL — ABNORMAL HIGH (ref 70–99)

## 2020-10-25 LAB — BASIC METABOLIC PANEL
Anion gap: 6 (ref 5–15)
BUN: 20 mg/dL (ref 8–23)
CO2: 25 mmol/L (ref 22–32)
Calcium: 9.3 mg/dL (ref 8.9–10.3)
Chloride: 107 mmol/L (ref 98–111)
Creatinine, Ser: 1.62 mg/dL — ABNORMAL HIGH (ref 0.44–1.00)
GFR, Estimated: 33 mL/min — ABNORMAL LOW (ref >60)
Glucose, Bld: 95 mg/dL (ref 70–99)
Potassium: 4.6 mmol/L (ref 3.5–5.1)
Sodium: 138 mmol/L (ref 135–145)

## 2020-10-25 NOTE — Op Note (Signed)
NAMEZAYRA, DEVITO MEDICAL RECORD NO: [id number] ACCOUNT NO: [id number] DATE OF BIRTH: 01-Apr-1946 FACILITY: [facility] LOCATION: [facility] PHYSICIAN: Vanita Panda. Magnus Ivan, MD  Operative Report   DATE OF PROCEDURE: 10/24/2020   PREOPERATIVE DIAGNOSES:  Severe end-stage arthritis and degenerative joint disease, right knee with valgus malalignment.  POSTOPERATIVE DIAGNOSES:  Severe end-stage arthritis and degenerative joint disease, right knee with valgus malalignment.  PROCEDURE:  Right total knee arthroplasty.  IMPLANTS:  Stryker Triathlon cemented knee system with size 4 femur, size 4 tibial tray, 11 mm fixed bearing polyethylene insert, size 29 patellar button.  SURGEON:  Doneen Poisson, M.D.  ASSISTANT:  Richardean Canal, PA-C.  ANESTHESIA:  1.  Right lower extremity adductor canal block. 2.  Spinal.  TOURNIQUET TIME:  Less than 1 hour.  ANTIBIOTICS:  2 g IV Ancef.  BLOOD LOSS:  100 mL  COMPLICATIONS:  None.  INDICATIONS:  The patient is a very pleasant 75 year old female with debilitating arthritis involving her right knee.  At this point, her pain is 10/10 and is detrimentally affecting her mobility, her quality of life and activities of daily living.  She  has had multiple interventions on that knee in the past including steroid injections and aspirations as well as a knee arthroscopy.  The knee has significant valgus malalignment and severe end-stage arthritis on the x-rays.  At this point, with the  failure of conservative treatment she does wish to proceed with a total knee arthroplasty.  We talked about the risk of acute blood loss anemia, nerve or vessel injury, fracture, infection, DVT, implant failure and skin and soft tissue issues.  We talked  about the goals being decreased pain, improved mobility and overall improve quality of life.  DESCRIPTION OF PROCEDURE:  After informed consent was obtained, appropriate right knee was marked, an adductor canal block was  obtained of the right lower extremity in the holding room.  She was then brought to the operating room and sat up on the  operating table.  Spinal anesthesia was obtained.  She was laid in supine position on the operating table.  Foley catheter was placed and a nonsterile tourniquet was placed around her upper right thigh.  Her right thigh, knee, leg, ankle and foot were  prepped and draped with DuraPrep and sterile drapes.  A timeout was called.  She was identified correct patient, correct right knee.  We then used Esmarch to wrap out the leg and tourniquet was inflated to 300 mm pressure.  I then made a direct midline  incision over the patella and carried this proximally and distally.  I dissected down the knee joint, carried out a medial parapatellar arthrotomy, finding a significant deformity with the knee.  There was large periarticular osteophytes around the knee  as well as loose bodies.  There was significant thickening of synovium.  We removed all the synovial tissue that we could from around the suprapatellar aspect of the knee.  We removed large osteophytes around the knee as well.  We did drain large  effusion from the knee as well.  With the knee in a flexed position, we used the extramedullary cutting guide to make our proximal tibia cut.  We set this for a neutral slope and correct her varus and valgus.  We made this cut without difficulty, but  then felt that we need to back it down 2 more mm, which we did.  We then used the intramedullary guide for the femur, a distal femoral cutting guide setting this for  a right knee at 5 degrees externally rotated for an 8 mm distal femoral cut.  We made  that cut without difficulty and brought that back down 2 mm as well.  With the knee in full extension we placed a 9 mm extension block and the knee was fully extended.  We then removed more debris from the back of the knee.  With the knee in a flexed  position, we placed extramedullary guide for femoral  sizing guide setting this off Whiteside's line and epicondylar axis.  Based off of this, we chose a size 4 femur.  We put a 4-in-1 cutting block for a size 4 femur, made our anterior and posterior  cuts, followed by our chamfer cuts.  We then made our femoral box cut, placed a size 4 femur.  We then went back to the tibia and chose a size 4 tibial tray for coverage, setting the rotation off the femur and the tibial tubercle.  We made our keel punch  off of this.  With a size 4 tibia tray and the size 4 right femur trial, we placed a 9 mm fixed bearing polyethylene insert and then an 11 mm and we were pleased with stability and range of motion with the 11 mm insert.  We then made our patellar cut  and drilled three holes for a size 29 patellar button put the knee through several cycles of motion.  We were pleased with range of motion and stability of the knee.  We then removed all instrumentation from the knee and irrigated the knee with normal  saline solution using pulsatile lavage.  We dried the knee real well and then mixed our cement.  Once the cement was starting to harden, we put the knee in a flexed position and then cemented our tibial tray, which was a size 4 Stryker tibial tray from  the [employer] knee system.  We cemented our real size 4 right femur.  We placed our real 11 mm fixed bearing polyethylene insert and cemented our patellar button.  We then held the knee fully extended and compressed while the cement hardened,  removing excess cement debris from the knee.  Once it hardened, we put the knee through several cycles of motion.  We were pleased with motion and stability.  We then let the tourniquet down.  Hemostasis obtained with electrocautery.  We closed the  arthrotomy with interrupted #1 Vicryl suture followed by 0 Vicryl for the deep tissue and 2-0 Vicryl to close subcutaneous tissue.  The skin was reapproximated with staples.  Xeroform well-padded sterile dressing was applied.   She was taken off the  operating table and taken to recovery room in stable condition with all final counts being correct and no complications noted.  Of note, Rexene Edison, PA-C, assisted during the entire case from beginning to end and his assistance was crucial for  facilitating all aspects of this case.   SUJ D: 10/24/2020 4:09:24 pm T: 10/25/2020 4:49:00 am  JOB: [id number]/ [id number]

## 2020-10-25 NOTE — TOC Initial Note (Signed)
Transition of Care [facility]) - Initial/Assessment Note    Patient Details  Name: Joy Fitzgerald MRN: [id number] Date of Birth: May 01, 1946  Transition of Care [facility]) CM/SW Contact:    Epifanio Lesches, RN Phone Number: 10/25/2020, 10:55 AM  Clinical Narrative:                 Pt presented  for elective R TKR. From home with daughter, Victorino Dike. States prior to hospital visit independent with ADL's. Already has DME, rolling walker and raised bars for toilet.      -s/p R TKA on 3/22  Per PT/OT evaluation/ recommendations: Home health PT;Supervision/Assistance - 24 hour. Evaluation shared with pt. Pt  agreeable to home health services. States will have 24/7 supervision /assistance from daughter, Victorino Dike. NCM shared with pt home health services were  setup with [facility] preoperatively and the start of care will be within 48 hours post d/c.  Pt states without Rx med concerns or affordability issues.  States sister Lupita Leash to provide transportation to home once d/c  [facility] team will continue to monitor and assist with needs....  Expected Discharge Plan: Home w Home Health Services Barriers to Discharge: Continued Medical Work up   Patient Goals and CMS Choice Patient states their goals for this hospitalization and ongoing recovery are:: get home and get better   Choice offered to / list presented to : Patient  Expected Discharge Plan and Services Expected Discharge Plan: Home w Home Health Services   Discharge Planning Services: CM Consult   Living arrangements for the past 2 months: Single Family Home                           HH Arranged: PT HH Agency: [facility] Date HH Agency Contacted: 10/25/20 Time HH Agency Contacted: 1052 Representative spoke with at [facility] Agency: [place]  Prior Living Arrangements/Services Living arrangements for the past 2 months: Single Family Home Lives with:: Adult Children Patient language and need for interpreter  reviewed:: Yes Do you feel safe going back to the place where you live?: Yes      Need for Family Participation in Patient Care: Yes (Comment) Care giver support system in place?: Yes (comment)   Criminal Activity/Legal Involvement Pertinent to Current Situation/Hospitalization: No - Comment as needed  Activities of Daily Living      Permission Sought/Granted                  Emotional Assessment Appearance:: Appears stated age Attitude/Demeanor/Rapport: Gracious Affect (typically observed): Accepting Orientation: : Oriented to Self,Oriented to Place,Oriented to  Time,Oriented to Situation Alcohol / Substance Use: Not Applicable Psych Involvement: No (comment)  Admission diagnosis:  Status post right knee replacement [Z96.651] Patient Active Problem List   Diagnosis Date Noted  . 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 Unilateral primary osteoarthritis, left knee 07/19/2020  . Unilateral primary osteoarthritis, right knee 07/19/2020  . Diabetes mellitus without complication (HCC) 03/29/2020  . Pseudophakia 03/29/2020  . Posterior vitreous detachment of both eyes 03/29/2020  . 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 Headache syndrome 06/25/2018   PCP:  [name], MD Pharmacy:   [id number] - [place], [place] - [address] AT [facility] OF [address] [place] [address] Phone: [phone] Fax: [phone]  [facility] - [place], [place] - [phone] [place]  [place] [address] Phone: [phone] Fax: [phone]     Social Determinants of Health (SDOH) Interventions    Readmission Risk Interventions No flowsheet data found.

## 2020-10-25 NOTE — Plan of Care (Signed)
  Problem: Pain Managment: Goal: General experience of comfort will improve Outcome: Progressing   Problem: Safety: Goal: Ability to remain free from injury will improve Outcome: Progressing   

## 2020-10-25 NOTE — Evaluation (Signed)
Physical Therapy Evaluation Patient Details Name: Joy Fitzgerald MRN: [id number] DOB: [birth year] Today's Date: 10/25/2020   History of Present Illness  Pt is a 75 yo female admitted for elective R TKA on 3/22. 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 Pt greatly limited by R knee pain. Pt constantly stating "It hurts so bad, I can't do it" in regard to any exercise or movement. Pt ultimately was able to transfer to EOB and complete std pvt to chair with min/modA. Dr. Magnus Ivan came in during PT eval and instructed pt that her pain management would be difficult as she was already on 10mg  of Oxy prior to surgery. Pt will benefit from skilled PT to increase their independence and safety with mobility to allow discharge to the venue listed below.      Follow Up Recommendations Home health PT;Supervision/Assistance - 24 hour    Equipment Recommendations  Rolling walker with 5" wheels (pt reports she purchased one)    Recommendations for Other Services       Precautions / Restrictions Precautions Precautions: Knee Precaution Booklet Issued: Yes (comment) Precaution Comments: pt with no carry over due to increased pain and being focused on pain Required Braces or Orthoses: Knee Immobilizer - Right Knee Immobilizer - Right: Discontinue post op day 2 Restrictions Weight Bearing Restrictions: Yes RLE Weight Bearing: Weight bearing as tolerated      Mobility  Bed Mobility Overal bed mobility: Needs Assistance Bed Mobility: Supine to Sit     Supine to sit: Min assist     General bed mobility comments: minA to achieve long sit position, max directional verbal cues for sequencing, minA for R LE management    Transfers Overall transfer level: Needs assistance Equipment used: Rolling walker (2 wheeled) Transfers: Sit to/from Sit to Stand: Mod assist Stand pivot transfers: Min assist       General transfer comment: modA to power up and stedy during transition of hands from bed to RW, max verbal cues to sequence steps during std pvt to chair, minA for walker management, minA at R knee to maintain R knee extension to prevent buckling during WBing  Ambulation/Gait             General Gait Details: unable to tolerate due to pain  Stairs            Wheelchair Mobility    Modified Rankin (Stroke Patients Only)       Balance Overall balance assessment: Needs assistance Sitting-balance support: Feet supported;Bilateral upper extremity supported Sitting balance-Leahy Scale: Fair Sitting balance - Comments: pt used bilat UE to support self to offweight R LE due to pain   Standing balance support: Bilateral upper extremity supported Standing balance-Leahy Scale: Poor Standing balance comment: dependent on R LE due to limited R LE wbing due ot pain                             Pertinent Vitals/Pain Pain Assessment: 0-10 Pain Score: 10-Worst pain ever Pain Location: R knee, "at the top" Pain Descriptors / Indicators: Sharp Pain Intervention(s): Patient requesting pain meds-RN notified    Home Living Family/patient expects to be discharged to:: Private residence Living Arrangements: Children Available Help at Discharge: Family;Available 24 hours/day Type of Home: House Home Access: Ramped entrance  Home Layout: One level Home Equipment: Walker - 2 wheels;Cane - single point;Shower seat      Prior Function Level of Independence: Independent         Comments: used cane PTA     Hand Dominance   Dominant Hand: Right    Extremity/Trunk Assessment   Upper Extremity Assessment Upper Extremity Assessment: Overall WFL for tasks assessed    Lower Extremity Assessment Lower Extremity Assessment: RLE deficits/detail RLE Deficits / Details: minimal active  movement t/o LE due to pain, minimal DF, able to initiate quad set but not hold, assisted with movement of LE to EOB    Cervical / Trunk Assessment Cervical / Trunk Assessment: Normal  Communication   Communication: HOH  Cognition Arousal/Alertness: Awake/alert Behavior During Therapy: Anxious (re: pain and mobility) Overall Cognitive Status: Within Functional Limits for tasks assessed                                 General Comments: pt in a lot of pain distracting from ability to follow commands and participate requiring increased time. max encouragement given to progress through the pain to participate in therapy. Dr. Magnus Ivan came in and stated her pain management would be difficulty as she was already on 10mg  of Oxy daily prior to coming in for surgery      General Comments General comments (skin integrity, edema, etc.): Pt with ace wrap and dressing over R knee, SpO2 at >94% on RA, ice applied at end of session to knee    Exercises Total Joint Exercises Ankle Circles/Pumps: AAROM;Right;Supine Quad Sets: AROM;Right;10 reps;Supine (trace) Heel Slides: AAROM;Right;10 reps;Seated (with foot on towel for ease of bringing heel back to towards bed) Goniometric ROM: pt able to achieve about 5 deg of active R Knee flexion, starting from 10 deg less of extension, able to achieve approx 20 deg of AA R knee flexion in sitting   Assessment/Plan    PT Assessment Patient needs continued PT services  PT Problem List Decreased strength;Decreased range of motion;Decreased activity tolerance;Decreased balance;Decreased mobility;Decreased coordination       PT Treatment Interventions DME instruction;Gait training;Functional mobility training;Therapeutic activities;Therapeutic exercise;Balance training    PT Goals (Current goals can be found in the Care Plan section)  Acute Rehab PT Goals Patient Stated Goal: stop the pain PT Goal Formulation: With patient Time For Goal  Achievement: 11/08/20 Potential to Achieve Goals: Good    Frequency 7X/week   Barriers to discharge        Co-evaluation               AM-PAC PT "6 Clicks" Mobility  Outcome Measure Help needed turning from your back to your side while in a flat bed without using bedrails?: A Little Help needed moving from lying on your back to sitting on the side of a flat bed without using bedrails?: A Little Help needed moving to and from a bed to a chair (including a wheelchair)?: A Little Help needed standing up from a chair using your arms (e.g., wheelchair or bedside chair)?: A Little Help needed to walk in hospital room?: A Little Help needed climbing 3-5 steps with a railing? : A Lot 6 Click Score: 17    End of Session Equipment Utilized During Treatment: Gait belt Activity Tolerance: Patient limited by pain Patient left: in chair;with call bell/phone within reach;with chair alarm set;with nursing/sitter in room Nurse Communication: Mobility status;Patient requests pain meds  PT Visit Diagnosis: Pain;Muscle weakness (generalized) (M62.81) Pain - Right/Left: Right Pain - part of body: Knee    Time: 0732-0815 PT Time Calculation (min) (ACUTE ONLY): 43 min   Charges:   PT Evaluation $PT Eval Moderate Complexity: 1 Mod PT Treatments $Gait Training: 8-22 mins        Lewis Shock, PT, DPT Acute Rehabilitation Services Pager #: [phone] Office #: [phone]   [name] Hansen 10/25/2020, 8:33 AM

## 2020-10-25 NOTE — Discharge Instructions (Signed)
INSTRUCTIONS AFTER JOINT REPLACEMENT  ° °o Remove items at home which could result in a fall. This includes throw rugs or furniture in walking pathways °o ICE to the affected joint every three hours while awake for 30 minutes at a time, for at least the first 3-5 days, and then as needed for pain and swelling.  Continue to use ice for pain and swelling. You may notice swelling that will progress down to the foot and ankle.  This is normal after surgery.  Elevate your leg when you are not up walking on it.   °o Continue to use the breathing machine you got in the hospital (incentive spirometer) which will help keep your temperature down.  It is common for your temperature to cycle up and down following surgery, especially at night when you are not up moving around and exerting yourself.  The breathing machine keeps your lungs expanded and your temperature down. ° ° °DIET:  As you were doing prior to hospitalization, we recommend a well-balanced diet. ° °DRESSING / WOUND CARE / SHOWERING ° °Keep the surgical dressing until follow up.  The dressing is water proof, so you can shower without any extra covering.  IF THE DRESSING FALLS OFF or the wound gets wet inside, change the dressing with sterile gauze.  Please use good hand washing techniques before changing the dressing.  Do not use any lotions or creams on the incision until instructed by your surgeon.   ° °ACTIVITY ° °o Increase activity slowly as tolerated, but follow the weight bearing instructions below.   °o No driving for 6 weeks or until further direction given by your physician.  You cannot drive while taking narcotics.  °o No lifting or carrying greater than 10 lbs. until further directed by your surgeon. °o Avoid periods of inactivity such as sitting longer than an hour when not asleep. This helps prevent blood clots.  °o You may return to work once you are authorized by your doctor.  ° ° ° °WEIGHT BEARING  ° °Weight bearing as tolerated with assist  device (walker, cane, etc) as directed, use it as long as suggested by your surgeon or therapist, typically at least 4-6 weeks. ° ° °EXERCISES ° °Results after joint replacement surgery are often greatly improved when you follow the exercise, range of motion and muscle strengthening exercises prescribed by your doctor. Safety measures are also important to protect the joint from further injury. Any time any of these exercises cause you to have increased pain or swelling, decrease what you are doing until you are comfortable again and then slowly increase them. If you have problems or questions, call your caregiver or physical therapist for advice.  ° °Rehabilitation is important following a joint replacement. After just a few days of immobilization, the muscles of the leg can become weakened and shrink (atrophy).  These exercises are designed to build up the tone and strength of the thigh and leg muscles and to improve motion. Often times heat used for twenty to thirty minutes before working out will loosen up your tissues and help with improving the range of motion but do not use heat for the first two weeks following surgery (sometimes heat can increase post-operative swelling).  ° °These exercises can be done on a training (exercise) mat, on the floor, on a table or on a bed. Use whatever works the best and is most comfortable for you.    Use music or television while you are exercising so that   the exercises are a pleasant break in your day. This will make your life better with the exercises acting as a break in your routine that you can look forward to.   Perform all exercises about fifteen times, three times per day or as directed.  You should exercise both the operative leg and the other leg as well. ° °Exercises include: °  °• Quad Sets - Tighten up the muscle on the front of the thigh (Quad) and hold for 5-10 seconds.   °• Straight Leg Raises - With your knee straight (if you were given a brace, keep it on),  lift the leg to 60 degrees, hold for 3 seconds, and slowly lower the leg.  Perform this exercise against resistance later as your leg gets stronger.  °• Leg Slides: Lying on your back, slowly slide your foot toward your buttocks, bending your knee up off the floor (only go as far as is comfortable). Then slowly slide your foot back down until your leg is flat on the floor again.  °• Angel Wings: Lying on your back spread your legs to the side as far apart as you can without causing discomfort.  °• Hamstring Strength:  Lying on your back, push your heel against the floor with your leg straight by tightening up the muscles of your buttocks.  Repeat, but this time bend your knee to a comfortable angle, and push your heel against the floor.  You may put a pillow under the heel to make it more comfortable if necessary.  ° °A rehabilitation program following joint replacement surgery can speed recovery and prevent re-injury in the future due to weakened muscles. Contact your doctor or a physical therapist for more information on knee rehabilitation.  ° ° °CONSTIPATION ° °Constipation is defined medically as fewer than three stools per week and severe constipation as less than one stool per week.  Even if you have a regular bowel pattern at home, your normal regimen is likely to be disrupted due to multiple reasons following surgery.  Combination of anesthesia, postoperative narcotics, change in appetite and fluid intake all can affect your bowels.  ° °YOU MUST use at least one of the following options; they are listed in order of increasing strength to get the job done.  They are all available over the counter, and you may need to use some, POSSIBLY even all of these options:   ° °Drink plenty of fluids (prune juice may be helpful) and high fiber foods °Colace 100 mg by mouth twice a day  °Senokot for constipation as directed and as needed Dulcolax (bisacodyl), take with full glass of water  °Miralax (polyethylene glycol)  once or twice a day as needed. ° °If you have tried all these things and are unable to have a bowel movement in the first 3-4 days after surgery call either your surgeon or your primary doctor.   ° °If you experience loose stools or diarrhea, hold the medications until you stool forms back up.  If your symptoms do not get better within 1 week or if they get worse, check with your doctor.  If you experience "the worst abdominal pain ever" or develop nausea or vomiting, please contact the office immediately for further recommendations for treatment. ° ° °ITCHING:  If you experience itching with your medications, try taking only a single pain pill, or even half a pain pill at a time.  You can also use Benadryl over the counter for itching or also to   help with sleep.   TED HOSE STOCKINGS:  Use stockings on both legs until for at least 2 weeks or as directed by physician office. They may be removed at night for sleeping.  MEDICATIONS:  See your medication summary on the After Visit Summary that nursing will review with you.  You may have some home medications which will be placed on hold until you complete the course of blood thinner medication.  It is important for you to complete the blood thinner medication as prescribed.  PRECAUTIONS:  If you experience chest pain or shortness of breath - call 911 immediately for transfer to the hospital emergency department.   If you develop a fever greater that 101 F, purulent drainage from wound, increased redness or drainage from wound, foul odor from the wound/dressing, or calf pain - CONTACT YOUR SURGEON.                                                   FOLLOW-UP APPOINTMENTS:  If you do not already have a post-op appointment, please call the office for an appointment to be seen by your surgeon.  Guidelines for how soon to be seen are listed in your After Visit Summary, but are typically between 1-4 weeks after surgery.  OTHER INSTRUCTIONS:   Knee  Replacement:  Do not place pillow under knee, focus on keeping the knee straight while resting. CPM instructions: 0-90 degrees, 2 hours in the morning, 2 hours in the afternoon, and 2 hours in the evening. Place foam block, curve side up under heel at all times except when in CPM or when walking.  DO NOT modify, tear, cut, or change the foam block in any way.  POST-OPERATIVE OPIOID TAPER INSTRUCTIONS:  It is important to wean off of your opioid medication as soon as possible. If you do not need pain medication after your surgery it is ok to stop day one.  Opioids include: o Codeine, Hydrocodone(Norco, Vicodin), Oxycodone(Percocet, oxycontin) and hydromorphone amongst others.   Long term and even short term use of opiods can cause: o Increased pain response o Dependence o Constipation o Depression o Respiratory depression o And more.   Withdrawal symptoms can include o Flu like symptoms o Nausea, vomiting o And more  Techniques to manage these symptoms o Hydrate well o Eat regular healthy meals o Stay active o Use relaxation techniques(deep breathing, meditating, yoga)  Do Not substitute Alcohol to help with tapering  If you have been on opioids for less than two weeks and do not have pain than it is ok to stop all together.   Plan to wean off of opioids o This plan should start within one week post op of your joint replacement. o Maintain the same interval or time between taking each dose and first decrease the dose.  o Cut the total daily intake of opioids by one tablet each day o Next start to increase the time between doses. o The last dose that should be eliminated is the evening dose.     MAKE SURE YOU:   Understand these instructions.   Get help right away if you are not doing well or get worse.    Thank you for letting us be a part of your medical care team.  It is a privilege we respect greatly.  We hope these instructions will  help you stay on track for a fast  and full recovery!     INSTRUCTIONS AFTER JOINT REPLACEMENT   o Remove items at home which could result in a fall. This includes throw rugs or furniture in walking pathways o ICE to the affected joint every three hours while awake for 30 minutes at a time, for at least the first 3-5 days, and then as needed for pain and swelling.  Continue to use ice for pain and swelling. You may notice swelling that will progress down to the foot and ankle.  This is normal after surgery.  Elevate your leg when you are not up walking on it.   o Continue to use the breathing machine you got in the hospital (incentive spirometer) which will help keep your temperature down.  It is common for your temperature to cycle up and down following surgery, especially at night when you are not up moving around and exerting yourself.  The breathing machine keeps your lungs expanded and your temperature down.   DIET:  As you were doing prior to hospitalization, we recommend a well-balanced diet.  DRESSING / WOUND CARE / SHOWERING  Keep the surgical dressing until follow up.  The dressing is water proof, so you can shower without any extra covering.  IF THE DRESSING FALLS OFF or the wound gets wet inside, change the dressing with sterile gauze.  Please use good hand washing techniques before changing the dressing.  Do not use any lotions or creams on the incision until instructed by your surgeon.    ACTIVITY  o Increase activity slowly as tolerated, but follow the weight bearing instructions below.   o No driving for 6 weeks or until further direction given by your physician.  You cannot drive while taking narcotics.  o No lifting or carrying greater than 10 lbs. until further directed by your surgeon. o Avoid periods of inactivity such as sitting longer than an hour when not asleep. This helps prevent blood clots.  o You may return to work once you are authorized by your doctor.     WEIGHT BEARING   Weight bearing as  tolerated with assist device (walker, cane, etc) as directed, use it as long as suggested by your surgeon or therapist, typically at least 4-6 weeks.   EXERCISES  Results after joint replacement surgery are often greatly improved when you follow the exercise, range of motion and muscle strengthening exercises prescribed by your doctor. Safety measures are also important to protect the joint from further injury. Any time any of these exercises cause you to have increased pain or swelling, decrease what you are doing until you are comfortable again and then slowly increase them. If you have problems or questions, call your caregiver or physical therapist for advice.   Rehabilitation is important following a joint replacement. After just a few days of immobilization, the muscles of the leg can become weakened and shrink (atrophy).  These exercises are designed to build up the tone and strength of the thigh and leg muscles and to improve motion. Often times heat used for twenty to thirty minutes before working out will loosen up your tissues and help with improving the range of motion but do not use heat for the first two weeks following surgery (sometimes heat can increase post-operative swelling).   These exercises can be done on a training (exercise) mat, on the floor, on a table or on a bed. Use whatever works the best and is most  comfortable for you.    Use music or television while you are exercising so that the exercises are a pleasant break in your day. This will make your life better with the exercises acting as a break in your routine that you can look forward to.   Perform all exercises about fifteen times, three times per day or as directed.  You should exercise both the operative leg and the other leg as well.  Exercises include:    Quad Sets - Tighten up the muscle on the front of the thigh (Quad) and hold for 5-10 seconds.    Straight Leg Raises - With your knee straight (if you were given  a brace, keep it on), lift the leg to 60 degrees, hold for 3 seconds, and slowly lower the leg.  Perform this exercise against resistance later as your leg gets stronger.   Leg Slides: Lying on your back, slowly slide your foot toward your buttocks, bending your knee up off the floor (only go as far as is comfortable). Then slowly slide your foot back down until your leg is flat on the floor again.   Angel Wings: Lying on your back spread your legs to the side as far apart as you can without causing discomfort.   Hamstring Strength:  Lying on your back, push your heel against the floor with your leg straight by tightening up the muscles of your buttocks.  Repeat, but this time bend your knee to a comfortable angle, and push your heel against the floor.  You may put a pillow under the heel to make it more comfortable if necessary.   A rehabilitation program following joint replacement surgery can speed recovery and prevent re-injury in the future due to weakened muscles. Contact your doctor or a physical therapist for more information on knee rehabilitation.    CONSTIPATION  Constipation is defined medically as fewer than three stools per week and severe constipation as less than one stool per week.  Even if you have a regular bowel pattern at home, your normal regimen is likely to be disrupted due to multiple reasons following surgery.  Combination of anesthesia, postoperative narcotics, change in appetite and fluid intake all can affect your bowels.   YOU MUST use at least one of the following options; they are listed in order of increasing strength to get the job done.  They are all available over the counter, and you may need to use some, POSSIBLY even all of these options:    Drink plenty of fluids (prune juice may be helpful) and high fiber foods Colace 100 mg by mouth twice a day  Senokot for constipation as directed and as needed Dulcolax (bisacodyl), take with full glass of water  Miralax  (polyethylene glycol) once or twice a day as needed.  If you have tried all these things and are unable to have a bowel movement in the first 3-4 days after surgery call either your surgeon or your primary doctor.    If you experience loose stools or diarrhea, hold the medications until you stool forms back up.  If your symptoms do not get better within 1 week or if they get worse, check with your doctor.  If you experience "the worst abdominal pain ever" or develop nausea or vomiting, please contact the office immediately for further recommendations for treatment.   ITCHING:  If you experience itching with your medications, try taking only a single pain pill, or even half a pain pill at  a time.  You can also use Benadryl over the counter for itching or also to help with sleep.   TED HOSE STOCKINGS:  Use stockings on both legs until for at least 2 weeks or as directed by physician office. They may be removed at night for sleeping.  MEDICATIONS:  See your medication summary on the After Visit Summary that nursing will review with you.  You may have some home medications which will be placed on hold until you complete the course of blood thinner medication.  It is important for you to complete the blood thinner medication as prescribed.  PRECAUTIONS:  If you experience chest pain or shortness of breath - call 911 immediately for transfer to the hospital emergency department.   If you develop a fever greater that 101 F, purulent drainage from wound, increased redness or drainage from wound, foul odor from the wound/dressing, or calf pain - CONTACT YOUR SURGEON.                                                   FOLLOW-UP APPOINTMENTS:  If you do not already have a post-op appointment, please call the office for an appointment to be seen by your surgeon.  Guidelines for how soon to be seen are listed in your After Visit Summary, but are typically between 1-4 weeks after surgery.  OTHER  INSTRUCTIONS:   Knee Replacement:  Do not place pillow under knee, focus on keeping the knee straight while resting. CPM instructions: 0-90 degrees, 2 hours in the morning, 2 hours in the afternoon, and 2 hours in the evening. Place foam block, curve side up under heel at all times except when in CPM or when walking.  DO NOT modify, tear, cut, or change the foam block in any way.  POST-OPERATIVE OPIOID TAPER INSTRUCTIONS:  It is important to wean off of your opioid medication as soon as possible. If you do not need pain medication after your surgery it is ok to stop day one.  Opioids include: o Codeine, Hydrocodone(Norco, Vicodin), Oxycodone(Percocet, oxycontin) and hydromorphone amongst others.   Long term and even short term use of opiods can cause: o Increased pain response o Dependence o Constipation o Depression o Respiratory depression o And more.   Withdrawal symptoms can include o Flu like symptoms o Nausea, vomiting o And more  Techniques to manage these symptoms o Hydrate well o Eat regular healthy meals o Stay active o Use relaxation techniques(deep breathing, meditating, yoga)  Do Not substitute Alcohol to help with tapering  If you have been on opioids for less than two weeks and do not have pain than it is ok to stop all together.   Plan to wean off of opioids o This plan should start within one week post op of your joint replacement. o Maintain the same interval or time between taking each dose and first decrease the dose.  o Cut the total daily intake of opioids by one tablet each day o Next start to increase the time between doses. o The last dose that should be eliminated is the evening dose.     MAKE SURE YOU:   Understand these instructions.   Get help right away if you are not doing well or get worse.    Thank you for letting us be a part of your medical  care team.  It is a privilege we respect greatly.  We hope these instructions will help you  stay on track for a fast and full recovery!

## 2020-10-25 NOTE — Progress Notes (Signed)
Physical Therapy Treatment Patient Details Name: Joy Fitzgerald MRN: [id number] DOB: 02-Jul-1946 Today's Date: 10/25/2020    History of Present Illness Pt is a 75 yo female admitted for elective R TKA on 3/22. 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 Pt crying again this afternoon asking for better pain management. RN and Dr. Magnus Ivan aware. Pt unable to ambulate again this afternoon. Attempted AAROM to R Knee however pt unable to tolerate >15 deg of knee flexion or into full extension. Acute PT to cont to follow.    Follow Up Recommendations  Home health PT;Supervision/Assistance - 24 hour (may need SNF if pt unable to amb tomorrow)     Equipment Recommendations  Rolling walker with 5" wheels    Recommendations for Other Services       Precautions / Restrictions Precautions Precautions: Knee Precaution Booklet Issued: Yes (comment) Precaution Comments: pt with no carry over due to increased pain and being focused on pain Required Braces or Orthoses: Knee Immobilizer - Right Knee Immobilizer - Right: Discontinue post op day 2 Restrictions Weight Bearing Restrictions: Yes RLE Weight Bearing: Weight bearing as tolerated    Mobility  Bed Mobility Overal bed mobility: Needs Assistance Bed Mobility: Sit to Supine       Sit to supine: Min assist   General bed mobility comments: minA for R LE management, pt able to pull self up in bed using UEs    Transfers Overall transfer level: Needs assistance Equipment used: Rolling walker (2 wheeled) Transfers: Sit to/from Stand Sit to Stand: Mod assist Stand pivot transfers: Min assist       General transfer comment: max directional verbal cues, pt with minimal R LE WBing, pt crying and stating "I can't do it" t/o duration of transfer  Ambulation/Gait             General Gait Details: unable to tolerate due to pain, despite max  encouragement   Stairs             Wheelchair Mobility    Modified Rankin (Stroke Patients Only)       Balance Overall balance assessment: Needs assistance Sitting-balance support: Feet supported;Bilateral upper extremity supported Sitting balance-Leahy Scale: Fair Sitting balance - Comments: pt used bilat UE to support self to offweight R LE due to pain   Standing balance support: Bilateral upper extremity supported Standing balance-Leahy Scale: Poor Standing balance comment: dependent on R LE due to limited R LE wbing due ot pain                            Cognition Arousal/Alertness: Awake/alert Behavior During Therapy: Anxious Overall Cognitive Status: Within Functional Limits for tasks assessed                                 General Comments: pt in a lot of pain distracting from ability to follow commands and participate requiring increased time. max encouragement given to progress through the pain to participate in therapy. Dr. Magnus Ivan came in and stated her pain management would be difficulty as she was already on 10mg  of Oxy daily prior to coming in for surgery      Exercises Other Exercises Other Exercises: attempted to complete R knee AA flexion however pt unable to tolerate and  asked to stop repeately, pt educated on the importance of movement to help with edema management and stiffness    General Comments General comments (skin integrity, edema, etc.): incisions with waterproof dressing on      Pertinent Vitals/Pain Pain Assessment: 0-10 Pain Score: 10-Worst pain ever Pain Location: R knee Pain Descriptors / Indicators: Sharp Pain Intervention(s): Patient requesting pain meds-RN notified    Home Living                      Prior Function            PT Goals (current goals can now be found in the care plan section) Acute Rehab PT Goals Patient Stated Goal: stop the pain Progress towards PT goals: Progressing  toward goals    Frequency    7X/week      PT Plan Current plan remains appropriate    Co-evaluation              AM-PAC PT "6 Clicks" Mobility   Outcome Measure  Help needed turning from your back to your side while in a flat bed without using bedrails?: A Little Help needed moving from lying on your back to sitting on the side of a flat bed without using bedrails?: A Little Help needed moving to and from a bed to a chair (including a wheelchair)?: A Little Help needed standing up from a chair using your arms (e.g., wheelchair or bedside chair)?: A Little Help needed to walk in hospital room?: A Little Help needed climbing 3-5 steps with a railing? : A Lot 6 Click Score: 17    End of Session Equipment Utilized During Treatment: Gait belt Activity Tolerance: Patient limited by pain Patient left: with call bell/phone within reach;in bed Nurse Communication: Mobility status;Patient requests pain meds PT Visit Diagnosis: Pain;Muscle weakness (generalized) (M62.81) Pain - Right/Left: Right Pain - part of body: Knee     Time: 8185-6314 PT Time Calculation (min) (ACUTE ONLY): 13 min  Charges:  $Therapeutic Activity: 8-22 mins                     Joy Fitzgerald, PT, DPT Acute Rehabilitation Services Pager #: [phone] Office #: [phone]    Joy Fitzgerald 10/25/2020, 1:28 PM

## 2020-10-25 NOTE — Progress Notes (Signed)
Subjective: 1 Day Post-Op Procedure(s) (LRB): RIGHT TOTAL KNEE ARTHROPLASTY (Right) Patient reports pain as severe.  Has been on high dose chronic opioids. Acute blood loss anemia from surgery, but tolerating well.  Objective: Vital signs in last 24 hours: Temp:  [96.8 F (36 C)-98.1 F (36.7 C)] 97.6 F (36.4 C) (03/23 0429) Pulse Rate:  [47-60] 54 (03/23 0429) Resp:  [9-20] 18 (03/23 0429) BP: (103-202)/(56-76) 117/60 (03/23 0429) SpO2:  [95 %-100 %] 95 % (03/23 0429) Weight:  [76.5 kg] 76.5 kg (03/22 1218)  Intake/Output from previous day: 03/22 0701 - 03/23 0700 In: 1741.1 [P.O.:100; I.V.:1641.1] Out: 1500 [Urine:1400; Blood:100] Intake/Output this shift: No intake/output data recorded.  Recent Labs    10/25/20 0429  HGB 9.2*   Recent Labs    10/25/20 0429  WBC 8.6  RBC 2.94*  HCT 27.5*  PLT 212   Recent Labs    10/25/20 0429  NA 138  K 4.6  CL 107  CO2 25  BUN 20  CREATININE 1.62*  GLUCOSE 95  CALCIUM 9.3   No results for input(s): LABPT, INR in the last 72 hours.  Sensation intact distally Intact pulses distally Dorsiflexion/Plantar flexion intact Incision: dressing C/D/I Compartment soft   Assessment/Plan: 1 Day Post-Op Procedure(s) (LRB): RIGHT TOTAL KNEE ARTHROPLASTY (Right) Up with therapy Discharge home with home health next 1-2 days      Joy Fitzgerald 10/25/2020, 7:46 AM

## 2020-10-25 NOTE — Progress Notes (Signed)
Patient is up in chair, alert and oriented. Requested  if could wait to discontinue FC later.

## 2020-10-26 DIAGNOSIS — M1711 Unilateral primary osteoarthritis, right knee: Secondary | ICD-10-CM | POA: Diagnosis not present

## 2020-10-26 LAB — CBC
HCT: 26 % — ABNORMAL LOW (ref 36.0–46.0)
Hemoglobin: 8.5 g/dL — ABNORMAL LOW (ref 12.0–15.0)
MCH: 30.2 pg (ref 26.0–34.0)
MCHC: 32.7 g/dL (ref 30.0–36.0)
MCV: 92.5 fL (ref 80.0–100.0)
Platelets: 210 10*3/uL (ref 150–400)
RBC: 2.81 MIL/uL — ABNORMAL LOW (ref 3.87–5.11)
RDW: 13.4 % (ref 11.5–15.5)
WBC: 11.3 10*3/uL — ABNORMAL HIGH (ref 4.0–10.5)
nRBC: 0 % (ref 0.0–0.2)

## 2020-10-26 MED ORDER — ASPIRIN 81 MG PO CHEW: 81.0000 mg | CHEWABLE_TABLET | Freq: Two times a day (BID) | ORAL | 0 refills | Status: DC

## 2020-10-26 MED ORDER — HYDROMORPHONE HCL 2 MG PO TABS: 2.0000 mg | ORAL_TABLET | ORAL | 0 refills | Status: DC | PRN

## 2020-10-26 MED ORDER — METHOCARBAMOL 500 MG PO TABS: 500.0000 mg | ORAL_TABLET | Freq: Four times a day (QID) | ORAL | 1 refills | Status: DC | PRN

## 2020-10-26 NOTE — Progress Notes (Signed)
Patient ID: Joy Fitzgerald, female   DOB: 03-05-1946, 75 y.o.   MRN: [id number] Due to the patient's lack of mobility, physical therapy is recommending short-term skilled nursing placement.  We will hold on her discharge today.  She will need a consultation by the transitional care team for short-term skilled nursing placement.  She will need an FL 2 on the chart as well.

## 2020-10-26 NOTE — Progress Notes (Signed)
Physical Therapy Treatment Patient Details Name: Joy Fitzgerald MRN: [id number] DOB: [birth year] Today's Date: 10/26/2020    History of Present Illness Pt is a 75 yo female admitted for elective R TKA on 3/22. 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  Pt needed help only to get RLE onto bed to return once done with BSC, and could pull herself back up on the bed.  Follow along with her to work on ROM to R knee, with extension stretches done once in supine.  Has had pain relief with ice, and encouraged her to ask staff to replenish ice packs during the day.  Manage her ROM and gait with RW by keeping pain controlled and should increase her feeling of safety with moving.   Follow Up Recommendations  Follow surgeon's recommendation for DC plan and follow-up therapies;SNF     Equipment Recommendations  Rolling walker with 5" wheels    Recommendations for Other Services       Precautions / Restrictions Precautions Precautions: Knee Precaution Booklet Issued: Yes (comment) Knee Immobilizer - Right: Discontinue post op day 2 Restrictions Weight Bearing Restrictions: Yes RLE Weight Bearing: Weight bearing as tolerated    Mobility  Bed Mobility Overal bed mobility: Needs Assistance Bed Mobility: Supine to Sit     Supine to sit: Min assist     General bed mobility comments: min mainly for RLE    Transfers Overall transfer level: Needs assistance Equipment used: Rolling walker (2 wheeled);1 person hand held assist Transfers: Sit to/from Stand Sit to Stand: Min assist;Mod assist         General transfer comment: min to steady with mod just for intial  Ambulation/Gait             General Gait Details: walking just to step to chair   Stairs             Wheelchair Mobility    Modified Rankin (Stroke Patients Only)        Balance Overall balance assessment: Needs assistance Sitting-balance support: Feet supported Sitting balance-Leahy Scale: Good     Standing balance support: Bilateral upper extremity supported;During functional activity Standing balance-Leahy Scale: Poor                              Cognition Arousal/Alertness: Awake/alert Behavior During Therapy: Anxious;WFL for tasks assessed/performed Overall Cognitive Status: Within Functional Limits for tasks assessed                                 General Comments: in pain but open to suggestions for mobility      Exercises Other Exercises Other Exercises: extension to -15 deg    General Comments General comments (skin integrity, edema, etc.): iced R knee and had less pain today during time she was OOB      Pertinent Vitals/Pain Pain Assessment: 0-10 Pain Score: 7  Pain Location: R knee Pain Descriptors / Indicators: Operative site guarding;Grimacing;Guarding Pain Intervention(s): Monitored during session;Premedicated before session    Home Living                      Prior Function  PT Goals (current goals can now be found in the care plan section) Acute Rehab PT Goals Patient Stated Goal: stop the pain Progress towards PT goals: Progressing toward goals    Frequency    7X/week      PT Plan Discharge plan needs to be updated    Co-evaluation              AM-PAC PT "6 Clicks" Mobility   Outcome Measure    Help needed moving from lying on your back to sitting on the side of a flat bed without using bedrails?: A Little Help needed moving to and from a bed to a chair (including a wheelchair)?: A Little Help needed standing up from a chair using your arms (e.g., wheelchair or bedside chair)?: A Little Help needed to walk in hospital room?: A Little Help needed climbing 3-5 steps with a railing? : A Lot 6 Click Score: 14    End of Session Equipment  Utilized During Treatment: Gait belt Activity Tolerance: Patient limited by pain Patient left: in bed;in chair Nurse Communication: Mobility status;Patient requests pain meds;Other (comment) (pt up in chair with feet on floor by preference) PT Visit Diagnosis: Pain;Muscle weakness (generalized) (M62.81) Pain - Right/Left: Right Pain - part of body: Knee     Time: 1555-1640 PT Time Calculation (min) (ACUTE ONLY): 45 min  Charges:  $Therapeutic Exercise: 8-22 mins $Therapeutic Activity: 23-37 mins                Ivar Drape 10/26/2020, 10:19 PM Samul Dada, PT MS Acute Rehab Dept. Number: [facility] [id number] and [facility] [phone]

## 2020-10-26 NOTE — Plan of Care (Signed)
  Problem: Pain Managment: Goal: General experience of comfort will improve Outcome: Progressing   Problem: Safety: Goal: Ability to remain free from injury will improve Outcome: Progressing   

## 2020-10-26 NOTE — Social Work (Signed)
PT updated recommendation to SNF from [facility] today. Pt agreeable to SNF for STR and prefers [facility]. RNCM met with pt and explained placement process. SNF search started and request for auth submitted to [employer]. Will need updated therapy notes indicating need for SNF. TOC will provide updates as available.   Wandra Feinstein, MSW, LCSW [phone] (coverage)

## 2020-10-26 NOTE — Progress Notes (Signed)
Physical Therapy Treatment Patient Details Name: Joy Fitzgerald MRN: [id number] DOB: 18-Oct-1945 Today's Date: 10/26/2020    History of Present Illness Pt is a 75 yo female admitted for elective R TKA on 3/22. 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  Pt sat with feet on the floor, able to flex about 70 deg on R knee.  Pt is in pain with mobility, voicing a pain level of 8 with more flexion or WB.  Ice applied to knee for comfort and pt then was more able to sit OOB in chair.  Pt is attended by her sister who is being supportive, and will recommend her to SNF as well.  Follow for goals of acute PT.   Follow Up Recommendations  Follow surgeon's recommendation for DC plan and follow-up therapies;SNF     Equipment Recommendations  Rolling walker with 5" wheels    Recommendations for Other Services       Precautions / Restrictions Precautions Precautions: Knee Precaution Booklet Issued: Yes (comment) Knee Immobilizer - Right: Discontinue post op day 2 Restrictions Weight Bearing Restrictions: Yes RLE Weight Bearing: Weight bearing as tolerated    Mobility  Bed Mobility Overal bed mobility: Needs Assistance Bed Mobility: Supine to Sit     Supine to sit: Min assist     General bed mobility comments: min mainly for RLE    Transfers Overall transfer level: Needs assistance Equipment used: Rolling walker (2 wheeled);1 person hand held assist Transfers: Sit to/from Stand Sit to Stand: Min assist;Mod assist         General transfer comment: min to steady with mod just for intial  Ambulation/Gait             General Gait Details: walking just to step to chair   Stairs             Wheelchair Mobility    Modified Rankin (Stroke Patients Only)        Balance Overall balance assessment: Needs assistance Sitting-balance support: Feet supported Sitting balance-Leahy Scale: Good     Standing balance support: Bilateral upper extremity supported;During functional activity Standing balance-Leahy Scale: Poor                              Cognition Arousal/Alertness: Awake/alert Behavior During Therapy: Anxious Overall Cognitive Status: Within Functional Limits for tasks assessed                                 General Comments: in pain but open to suggestions for mobility      Exercises Other Exercises Other Exercises: flexion to 70 deg on R knee    General Comments General comments (skin integrity, edema, etc.): pt is wearing a clean post op dressing on R knee, very good with minor edema      Pertinent Vitals/Pain Pain Assessment: 0-10 Pain Score: 7  Pain Location: R knee Pain Descriptors / Indicators: Operative site guarding;Grimacing;Guarding Pain Intervention(s): Limited activity within patient's tolerance;Monitored during session;Premedicated before session;Repositioned;Ice applied    Home Living  Prior Function            PT Goals (current goals can now be found in the care plan section) Acute Rehab PT Goals Patient Stated Goal: stop the pain Progress towards PT goals: Progressing toward goals    Frequency    7X/week      PT Plan Discharge plan needs to be updated    Co-evaluation              AM-PAC PT "6 Clicks" Mobility   Outcome Measure    Help needed moving from lying on your back to sitting on the side of a flat bed without using bedrails?: A Little Help needed moving to and from a bed to a chair (including a wheelchair)?: A Little Help needed standing up from a chair using your arms (e.g., wheelchair or bedside chair)?: A Little Help needed to walk in hospital room?: A Little Help needed climbing 3-5 steps with a railing? : A Lot 6  Click Score: 14    End of Session Equipment Utilized During Treatment: Gait belt Activity Tolerance: Patient limited by pain Patient left: in bed;in chair Nurse Communication: Mobility status;Patient requests pain meds;Other (comment) (pt up in chair with feet on floor by preference) PT Visit Diagnosis: Pain;Muscle weakness (generalized) (M62.81) Pain - Right/Left: Right Pain - part of body: Knee     Time: 1228-1300 PT Time Calculation (min) (ACUTE ONLY): 32 min  Charges:  $Therapeutic Activity: 23-37 mins                 Ivar Drape 10/26/2020, 5:14 PM Samul Dada, PT MS Acute Rehab Dept. Number: [facility] [id number] and [facility] [phone]

## 2020-10-26 NOTE — Plan of Care (Signed)

## 2020-10-26 NOTE — TOC Progression Note (Signed)
Transition of Care [facility]) - Progression Note    Patient Details  Name: [place] Dalporto MRN: [id number] Date of Birth: [birth year]  [facility] CM/SW Contact  Epifanio Lesches, RN Phone Number: 10/26/2020, 3:42 PM  Clinical Narrative:          -  -s/p R TKA on 3/22 NCM received consult for possible SNF placement at time of discharge instead of home with home health services per recent  PT evaluation. NCM spoke with patient and pt's sister Lupita Leash @ bedside regarding PT recommendation of SNF placement  @ d/c. Patient reported that daughter Victorino Dike is currently unable to care for her at their home given patient's current physical needs and fall risk. Patient expressed understanding of PT recommendation and is agreeable to SNF placement at time of discharge. Patient reports preference for    [facility] . NCM discussed insurance authorization process and provided Medicare SNF ratings list. No further questions reported at this time. RNCM to continue to follow and assist with discharge planning needs.  Pt COVID vaccinated and boosted.   Expected Discharge Plan: Skilled Nursing Facility Barriers to Discharge: Insurance Authorization,No SNF bed  Expected Discharge Plan and Services Expected Discharge Plan: Skilled Nursing Facility   Discharge Planning Services: CM Consult   Living arrangements for the past 2 months: Single Family Home Expected Discharge Date: 10/26/20                         HH Arranged: PT HH Agency: [facility] Date HH Agency Contacted: 10/25/20 Time HH Agency Contacted: 1052 Representative spoke with at [facility] Agency: [place]   Social Determinants of Health (SDOH) Interventions    Readmission Risk Interventions No flowsheet data found.

## 2020-10-26 NOTE — Progress Notes (Signed)
Subjective: 2 Days Post-Op Procedure(s) (LRB): RIGHT TOTAL KNEE ARTHROPLASTY (Right) Patient reports pain as severe.  Slow progress with PT yesterday due to pain.   Objective: Vital signs in last 24 hours: Temp:  [97.9 F (36.6 C)-98.6 F (37 C)] 98.6 F (37 C) (03/24 0735) Pulse Rate:  [57-71] 65 (03/24 0735) Resp:  [14-17] 17 (03/24 0735) BP: (132-172)/(54-67) 172/67 (03/24 0735) SpO2:  [92 %-100 %] 94 % (03/24 0735)  Intake/Output from previous day: 03/23 0701 - 03/24 0700 In: 240 [P.O.:240] Out: -  Intake/Output this shift: No intake/output data recorded.  Recent Labs    10/25/20 0429 10/26/20 0432  HGB 9.2* 8.5*   Recent Labs    10/25/20 0429 10/26/20 0432  WBC 8.6 11.3*  RBC 2.94* 2.81*  HCT 27.5* 26.0*  PLT 212 210   Recent Labs    10/25/20 0429  NA 138  K 4.6  CL 107  CO2 25  BUN 20  CREATININE 1.62*  GLUCOSE 95  CALCIUM 9.3   No results for input(s): LABPT, INR in the last 72 hours.  Right lower Extremity: Dorsiflexion/Plantar flexion intact Incision: dressing C/D/I Compartment soft   Assessment/Plan: 2 Days Post-Op Procedure(s) (LRB): RIGHT TOTAL KNEE ARTHROPLASTY (Right) Up with therapy  Discharge to home later today if patient progresses well with PT.      Joy Fitzgerald 10/26/2020, 8:54 AM

## 2020-10-26 NOTE — Discharge Summary (Signed)
Patient ID: Caley Ciaramitaro MRN: [id number] DOB/AGE: 76/05/1946 76 y.o.  Admit date: 10/24/2020 Discharge date: 10/26/2020  Admission Diagnoses:  Principal Problem:   Unilateral primary osteoarthritis, right knee Active Problems:   Status post right knee replacement   Discharge Diagnoses:  Same  Past Medical History:  Diagnosis Date  . CKD (chronic kidney disease)   . Diabetes mellitus without complication (HCC)   . Dyspnea   . Headache syndrome 06/25/2018  . Hypertension   . Neck pain   . 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 female who was admitted 10/24/2020 for operative treatment ofUnilateral primary osteoarthritis, right knee. Patient has severe unremitting pain that affects sleep, daily activities, and work/hobbies. After pre-op clearance the patient was taken to the operating room on 10/24/2020 and underwent  Procedure(s): RIGHT TOTAL KNEE ARTHROPLASTY.    Patient was given perioperative antibiotics:  Anti-infectives (From admission, onward)   Start     Dose/Rate Route Frequency Ordered Stop   10/25/20 0600  ceFAZolin (ANCEF) IVPB 2g/100 mL premix        2 g 200 mL/hr over 30 Minutes Intravenous On call to O.R. 10/24/20 1209 10/24/20 1439   10/24/20 2100  ceFAZolin (ANCEF) IVPB 1 g/50 mL premix        1 g 100 mL/hr over 30 Minutes Intravenous Every 6 hours 10/24/20 2001 10/25/20 0230       Patient was given sequential compression devices, early ambulation, and chemoprophylaxis to prevent DVT.  Patient benefited maximally from hospital stay and there were no complications.    Recent vital signs:  Patient Vitals for the past 24 hrs:  BP Temp Temp src Pulse Resp SpO2  10/26/20 0735 (!) 172/67 98.6 F (37 C) Oral 65 17 94 %  10/26/20 0416 (!) 161/55 98.3 F (36.8 C) - 61 17 98 %  10/26/20  0000 (!) 137/59 98 F (36.7 C) Oral 70 17 95 %  10/25/20 1945 (!) 132/54 98.2 F (36.8 C) Oral 64 16 92 %  10/25/20 1652 (!) 151/54 97.9 F (36.6 C) Oral 71 14 94 %  10/25/20 1151 (!) 167/55 97.9 F (36.6 C) Oral (!) 57 14 100 %     Recent laboratory studies:  Recent Labs    10/25/20 0429 10/26/20 0432  WBC 8.6 11.3*  HGB 9.2* 8.5*  HCT 27.5* 26.0*  PLT 212 210  NA 138  --   K 4.6  --   CL 107  --   CO2 25  --   BUN 20  --   CREATININE 1.62*  --   GLUCOSE 95  --   CALCIUM 9.3  --      Discharge Medications:   Allergies as of 10/26/2020      Reactions   Codeine Nausea And Vomiting      Medication List    STOP taking these medications   aspirin EC 81 MG tablet Replaced by: aspirin 81 MG chewable tablet   naproxen 500 MG tablet Commonly known as: NAPROSYN   oxyCODONE-acetaminophen 10-325 MG tablet Commonly known as: PERCOCET     TAKE these medications   ALPRAZolam 1 MG tablet Commonly known as: XANAX Take 1 mg by mouth See admin instructions. Take 1 mg by mouth at bedtime and an additional 1 mg up to two times a day as needed for anxiety  aspirin 81 MG chewable tablet Chew 1 tablet (81 mg total) by mouth 2 (two) times daily. Replaces: aspirin EC 81 MG tablet   HYDROmorphone 2 MG tablet Commonly known as: DILAUDID Take 1 tablet (2 mg total) by mouth every 4 (four) hours as needed for severe pain.   losartan 100 MG tablet Commonly known as: COZAAR Take 100 mg by mouth daily.   metFORMIN 500 MG tablet Commonly known as: GLUCOPHAGE Take 500 mg by mouth 2 (two) times daily with a meal.   methocarbamol 500 MG tablet Commonly known as: ROBAXIN Take 1 tablet (500 mg total) by mouth every 6 (six) hours as needed for muscle spasms.   nebivolol 10 MG tablet Commonly known as: BYSTOLIC Take 10 mg by mouth daily.   omeprazole 20 MG capsule Commonly known as: PRILOSEC Take 20 mg by mouth daily before breakfast.   pravastatin 40 MG tablet Commonly  known as: PRAVACHOL Take 40 mg by mouth daily after lunch.            Durable Medical Equipment  (From admission, onward)         Start     Ordered   10/24/20 2002  DME 3 n 1  Once        10/24/20 2001   10/24/20 2002  DME Walker rolling  Once       Question Answer Comment  Walker: With 5 Inch Wheels   Patient needs a walker to treat with the following condition Status post total right knee replacement      10/24/20 2001          Diagnostic Studies: DG Knee Right Port  Result Date: 10/24/2020 CLINICAL DATA:  Postop total knee replacement EXAM: PORTABLE RIGHT KNEE - 1-2 VIEW COMPARISON:  None. FINDINGS: The patient is status post right total knee arthroplasty. No periprosthetic lucency or fracture is identified. A joint effusion with subcutaneous emphysema seen. Overlying surgical staples are present. IMPRESSION: Status post right total knee arthroplasty without acute complication. Electronically Signed   By: Jonna Clark M.D.   On: 10/24/2020 17:21    Disposition: Discharge disposition: 01-Home or Self Care          Follow-up Information    Kathryne Hitch, MD Follow up in 2 week(s).   Specialty: Orthopedic Surgery Contact information: [address] [place] [address] [phone]        [facility] Follow up.   Specialty: Home Health Services Why: home health PT services will be provided by [facility], start of care with 48 hours post discharge  Contact information: [address] [place] [address] [phone]                Signed: Kathryne Hitch 10/26/2020, 9:03 AM

## 2020-10-26 NOTE — NC FL2 (Signed)
[place] MEDICAID FL2 LEVEL OF CARE SCREENING TOOL     IDENTIFICATION  Patient Name: Joy Fitzgerald Birthdate: 10-Aug-1945 Sex: female Admission Date (Current Location): 10/24/2020  [facility] and [place] Number:  Producer, television/film/video and Address:  [facility] [place], [address] [place], [place] [address]      Provider Number: [id number]  Attending Physician Name and Address:  Kathryne Hitch  Relative Name and Phone Number:       Current Level of Care: Hospital Recommended Level of Care: Skilled Nursing Facility Prior Approval Number:    Date Approved/Denied:   PASRR Number: [id number]  Discharge Plan: SNF    Current Diagnoses: Patient Active Problem List   Diagnosis Date Noted  . 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 Unilateral primary osteoarthritis, left knee 07/19/2020  . Unilateral primary osteoarthritis, right knee 07/19/2020  . Diabetes mellitus without complication (HCC) 03/29/2020  . Pseudophakia 03/29/2020  . Posterior vitreous detachment of both eyes 03/29/2020  . 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 Headache syndrome 06/25/2018    Orientation RESPIRATION BLADDER Height & Weight     Self,Time,Situation,Place  Normal Continent Weight: 168 lb 10.4 oz (76.5 kg) Height:  5\' 8"  (172.7 cm)  BEHAVIORAL SYMPTOMS/MOOD NEUROLOGICAL BOWEL NUTRITION STATUS      Continent    AMBULATORY STATUS COMMUNICATION OF NEEDS Skin   Limited Assist Verbally Surgical wounds                       Personal Care Assistance Level of Assistance  Bathing,Dressing Bathing Assistance: Limited assistance   Dressing Assistance: Limited assistance     Functional Limitations Info  Sight,Hearing,Speech   Hearing Info: Adequate Speech Info: Adequate    SPECIAL CARE FACTORS FREQUENCY  PT (By licensed PT),OT (By licensed OT)                    Contractures Contractures Info: Not present    Additional Factors  Info  Code Status Code Status Info: full code             Current Medications (10/26/2020):  This is the current hospital active medication list Current Facility-Administered Medications  Medication Dose Route Frequency Provider Last Rate Last Admin  . 0.9 %  sodium chloride infusion   Intravenous Continuous 10/28/2020, MD 75 mL/hr at 10/24/20 2057 New Bag at 10/24/20 2057  . acetaminophen (TYLENOL) tablet 325-650 mg  325-650 mg Oral Q6H PRN 2058, MD      . ALPRAZolam Kathryne Hitch) tablet 1 mg  1 mg Oral QHS PRN Prudy Feeler, MD   1 mg at 10/25/20 2236  . alum & mag hydroxide-simeth (MAALOX/MYLANTA) 200-200-20 MG/5ML suspension 30 mL  30 mL Oral Q4H PRN 02-11-2001, MD      . aspirin chewable tablet 81 mg  81 mg Oral BID Kathryne Hitch, MD   81 mg at 10/26/20 0913  . diphenhydrAMINE (BENADRYL) 12.5 MG/5ML elixir 12.5-25 mg  12.5-25 mg Oral Q4H PRN 09-04-1984, MD      . docusate sodium (COLACE) capsule 100 mg  100 mg Oral BID Kathryne Hitch, MD   100 mg at 10/26/20 0913  . HYDROmorphone (DILAUDID) injection 0.5-1 mg  0.5-1 mg Intravenous Q2H PRN 10/28/20, MD   1 mg at 10/25/20 1547  . HYDROmorphone (DILAUDID) tablet 2 mg  2 mg Oral Q4H PRN 10/27/20, MD  2 mg at 10/26/20 0912  . losartan (COZAAR) tablet 100 mg  100 mg Oral Daily Kathryne Hitch, MD   100 mg at 10/26/20 0912  . menthol-cetylpyridinium (CEPACOL) lozenge 3 mg  1 lozenge Oral PRN Kathryne Hitch, MD       Or  . phenol (CHLORASEPTIC) mouth spray 1 spray  1 spray Mouth/Throat PRN Kathryne Hitch, MD      . methocarbamol (ROBAXIN) tablet 500 mg  500 mg Oral Q6H PRN Kathryne Hitch, MD   500 mg at 10/26/20 6073   Or  . methocarbamol (ROBAXIN) 500 mg in dextrose 5 % 50 mL IVPB  500 mg Intravenous Q6H PRN Kathryne Hitch, MD      . metoCLOPramide (REGLAN) tablet 5-10 mg  5-10 mg Oral Q8H  PRN Kathryne Hitch, MD       Or  . metoCLOPramide (REGLAN) injection 5-10 mg  5-10 mg Intravenous Q8H PRN Kathryne Hitch, MD      . nebivolol (BYSTOLIC) tablet 10 mg  10 mg Oral Daily Kathryne Hitch, MD   10 mg at 10/26/20 0912  . ondansetron (ZOFRAN) tablet 4 mg  4 mg Oral Q6H PRN Kathryne Hitch, MD       Or  . ondansetron [facility]) injection 4 mg  4 mg Intravenous Q6H PRN Kathryne Hitch, MD      . oxyCODONE (Oxy IR/ROXICODONE) immediate release tablet 10-15 mg  10-15 mg Oral Q4H PRN Kathryne Hitch, MD   10 mg at 10/26/20 0201  . oxyCODONE (Oxy IR/ROXICODONE) immediate release tablet 5-10 mg  5-10 mg Oral Q4H PRN Kathryne Hitch, MD   10 mg at 10/26/20 1227  . pantoprazole (PROTONIX) EC tablet 40 mg  40 mg Oral Daily Kathryne Hitch, MD   40 mg at 10/26/20 0913  . polyethylene glycol (MIRALAX / GLYCOLAX) packet 17 g  17 g Oral Daily PRN Kathryne Hitch, MD      . pravastatin (PRAVACHOL) tablet 40 mg  40 mg Oral QPC lunch Kathryne Hitch, MD   40 mg at 10/26/20 1236  . zolpidem (AMBIEN) tablet 5 mg  5 mg Oral QHS PRN Kathryne Hitch, MD         Discharge Medications: Please see discharge summary for a list of discharge medications.  Relevant Imaging Results:  Relevant Lab Results:   Additional Information SS# [id number]  [place], [place]

## 2020-10-27 DIAGNOSIS — M1711 Unilateral primary osteoarthritis, right knee: Secondary | ICD-10-CM | POA: Diagnosis not present

## 2020-10-27 LAB — SARS CORONAVIRUS 2 (TAT 6-24 HRS): SARS Coronavirus 2: NEGATIVE

## 2020-10-27 MED ORDER — ASPIRIN 81 MG PO CHEW: 81.0000 mg | CHEWABLE_TABLET | Freq: Two times a day (BID) | ORAL | 0 refills | Status: AC

## 2020-10-27 MED ORDER — HYDROMORPHONE HCL 2 MG PO TABS: 2.0000 mg | ORAL_TABLET | ORAL | 0 refills | Status: AC | PRN

## 2020-10-27 MED ORDER — METHOCARBAMOL 500 MG PO TABS: 500.0000 mg | ORAL_TABLET | Freq: Four times a day (QID) | ORAL | 1 refills | Status: AC | PRN

## 2020-10-27 NOTE — Progress Notes (Signed)
Physical Therapy Treatment Patient Details Name: Joy Fitzgerald MRN: [id number] DOB: 05-08-1946 Today's Date: 10/27/2020    History of Present Illness Pt is a 75 yo female admitted for elective R TKA on 3/22. 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  Min assist to roll and remove, then repositioned to do bed ex.  Pt is asking for the purwick, but talked with her about the need to move and that this is a large step backward.  Follow up with her to continue to get up and move, OOB to chair and to control her RLE in standing posture, which was quite improved yesterday PM.  Pt is asking for bedpan at end of session again but declined to get up to [facility].     Follow Up Recommendations  Follow surgeon's recommendation for DC plan and follow-up therapies;SNF     Equipment Recommendations  Rolling walker with 5" wheels    Recommendations for Other Services       Precautions / Restrictions Precautions Precautions: Knee Knee Immobilizer - Right: Discontinue post op day 2 Restrictions Weight Bearing Restrictions: Yes RLE Weight Bearing: Weight bearing as tolerated    Mobility  Bed Mobility Overal bed mobility: Needs Assistance Bed Mobility: Rolling (scooting up in bed) Rolling: Min assist         General bed mobility comments: minor help to roll but is better able to assist with this    Transfers Overall transfer level: Needs assistance               General transfer comment: declines to try to get up  Ambulation/Gait             General Gait Details: declines   Stairs             Wheelchair Mobility    Modified Rankin (Stroke Patients Only)       Balance Overall balance assessment: Needs assistance                                          Cognition Arousal/Alertness: Awake/alert Behavior  During Therapy: Anxious Overall Cognitive Status: Within Functional Limits for tasks assessed                                 General Comments: declines OOB      Exercises Total Joint Exercises Ankle Circles/Pumps: AROM;Both;5 reps Quad Sets: AROM;Both;10 reps Gluteal Sets: AROM;Both;10 reps Heel Slides: AAROM;AROM;Both;10 reps Hip ABduction/ADduction: AROM;AAROM;Both;10 reps Straight Leg Raises: AROM;AAROM;Both;10 reps Goniometric ROM: knee ext -15, flexion 45 deg    General Comments General comments (skin integrity, edema, etc.): talked with patient about ice for knee and nursing told her that the patient did not have orders      Pertinent Vitals/Pain Pain Assessment: Faces Faces Pain Scale: Hurts whole lot Pain Location: R knee with bending Pain Descriptors / Indicators: Operative site guarding;Grimacing;Guarding Pain Intervention(s): Premedicated before session;Repositioned;Monitored during session;Limited activity within patient's tolerance;Patient requesting pain meds-RN notified    Home Living                      Prior Function  PT Goals (current goals can now be found in the care plan section) Acute Rehab PT Goals Patient Stated Goal: stop the pain Progress towards PT goals: Not progressing toward goals - comment    Frequency    7X/week      PT Plan Current plan remains appropriate    Co-evaluation              AM-PAC PT "6 Clicks" Mobility   Outcome Measure  Help needed turning from your back to your side while in a flat bed without using bedrails?: A Little Help needed moving from lying on your back to sitting on the side of a flat bed without using bedrails?: A Little Help needed moving to and from a bed to a chair (including a wheelchair)?: A Little Help needed standing up from a chair using your arms (e.g., wheelchair or bedside chair)?: A Little Help needed to walk in hospital room?: A Lot Help needed  climbing 3-5 steps with a railing? : Total 6 Click Score: 15    End of Session   Activity Tolerance: Patient limited by pain Patient left: in bed;with call bell/phone within reach;with bed alarm set Nurse Communication: Mobility status PT Visit Diagnosis: Pain;Difficulty in walking, not elsewhere classified (R26.2);Muscle weakness (generalized) (M62.81) Pain - Right/Left: Right Pain - part of body: Knee     Time: 7371-0626 PT Time Calculation (min) (ACUTE ONLY): 36 min  Charges:  $Therapeutic Exercise: 8-22 mins $Therapeutic Activity: 8-22 mins                     Ivar Drape 10/27/2020, 4:42 PM Samul Dada, PT MS Acute Rehab Dept. Number: [facility] [id number] and [facility] [phone]

## 2020-10-27 NOTE — Progress Notes (Signed)
Patient ID: Joy Fitzgerald, female   DOB: August 19, 1945, 75 y.o.   MRN: [id number] The patient has had quite a difficult time with mobility and pain control.  She was on chronic pain medication prior to surgery.  Therapy has been working with her.  She is not safe for discharge directly to home due to the significant assistance she needs for mobilizing and her being a fall risk.  It has been recommended that short-term skilled nursing would benefit the patient for continued rehabilitation.  A FL 2 has been signed.  She can be discharged to skilled nursing when a bed is available.

## 2020-10-27 NOTE — TOC Progression Note (Addendum)
Transition of Care [facility]) - Progression Note    Patient Details  Name: Joy Fitzgerald MRN: [id number] Date of Birth: 08-Aug-1945  Transition of Care [facility]) CM/SW [place], [place] Phone Number: 10/27/2020, 2:32 PM  Clinical Narrative:    CSW followed up with [facility] who noted that they cannot take pt over the weekend. Pt insurance auth and covid still pending at this time. Plan to DC to [facility] on Monday. CSW met with pt and sister in the room and answered all questions regarding the SNF process. SW will continue to follow for DC needs.   Expected Discharge Plan: Skilled Nursing Facility Barriers to Discharge: Insurance Authorization,No SNF bed  Expected Discharge Plan and Services Expected Discharge Plan: [place]   Discharge Planning Services: CM Consult   Living arrangements for the past 2 months: Single Family Home Expected Discharge Date: 10/27/20                         HH Arranged: PT [place]: [facility] [employer] Date [place]: 10/25/20 Time [place]: 1052 Representative spoke with at [place]: [place]   Social Determinants of Health ([place]) Interventions    Readmission Risk Interventions No flowsheet data found.

## 2020-10-27 NOTE — Progress Notes (Signed)
Physical Therapy Treatment Patient Details Name: Joy Fitzgerald MRN: [id number] DOB: March 13, 1946 Today's Date: 10/27/2020    History of Present Illness Pt is a 75 yo female admitted for elective R TKA on 3/22. 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  Her plan is to get up to chair but will not agree to this.  Pt was on bed pan again and had voided a very small amount but also asking to use bedpain again in a very short time.  Talked with nursing about ice for pain and possibility of UTI, and nurse came in to assess for knee pain and warmth based on PT notes.  Provided ice to pt but nursing asking to wait for MD to see the knee.  Follow up for further mobility as pt can tolerate, and may benefit to try to see her in beginning of her window for pain meds.   Follow Up Recommendations  Follow surgeon's recommendation for DC plan and follow-up therapies;SNF     Equipment Recommendations  Rolling walker with 5" wheels    Recommendations for Other Services       Precautions / Restrictions Precautions Precautions: Knee Knee Immobilizer - Right: Discontinue post op day 2 Restrictions Weight Bearing Restrictions: Yes RLE Weight Bearing: Weight bearing as tolerated    Mobility  Bed Mobility Overal bed mobility: Needs Assistance Bed Mobility: Rolling (scooting up in bed) Rolling: Min assist         General bed mobility comments: minor help to roll but is better able to assist with this    Transfers Overall transfer level: Needs assistance               General transfer comment: declines to try to get up  Ambulation/Gait             General Gait Details: declines   Stairs             Wheelchair Mobility    Modified Rankin (Stroke Patients Only)       Balance Overall balance assessment: Needs assistance                                           Cognition Arousal/Alertness: Awake/alert;Lethargic Behavior During Therapy: Anxious Overall Cognitive Status: Within Functional Limits for tasks assessed                                 General Comments: declines OOB      Exercises Total Joint Exercises Ankle Circles/Pumps: AROM;Both;5 reps Quad Sets: AROM;Both;10 reps Gluteal Sets: AROM;Both;10 reps Heel Slides: AAROM;AROM;Both;10 reps Hip ABduction/ADduction: AROM;AAROM;Both;10 reps Straight Leg Raises: AROM;AAROM;Both;10 reps Goniometric ROM: knee ext -15, flexion 51    General Comments General comments (skin integrity, edema, etc.): talked with patient about ice for knee and nursing told her that the patient did not have orders      Pertinent Vitals/Pain Pain Assessment: Faces Faces Pain Scale: Hurts whole lot Pain Location: R knee with bending Pain Descriptors / Indicators: Operative site guarding;Grimacing;Guarding Pain Intervention(s): Limited activity within patient's tolerance;Monitored during session;Premedicated before session;Repositioned    Home Living  Prior Function            PT Goals (current goals can now be found in the care plan section) Acute Rehab PT Goals Patient Stated Goal: stop the pain Progress towards PT goals: Not progressing toward goals - comment    Frequency    7X/week      PT Plan Current plan remains appropriate    Co-evaluation              AM-PAC PT "6 Clicks" Mobility   Outcome Measure  Help needed turning from your back to your side while in a flat bed without using bedrails?: A Little Help needed moving from lying on your back to sitting on the side of a flat bed without using bedrails?: A Little Help needed moving to and from a bed to a chair (including a wheelchair)?: A Little Help needed standing up from a chair using your arms (e.g., wheelchair or bedside chair)?: A Little Help needed  to walk in hospital room?: A Lot Help needed climbing 3-5 steps with a railing? : Total 6 Click Score: 15    End of Session   Activity Tolerance: Patient limited by pain Patient left: in bed;with call bell/phone within reach;with bed alarm set Nurse Communication: Mobility status PT Visit Diagnosis: Pain;Difficulty in walking, not elsewhere classified (R26.2);Muscle weakness (generalized) (M62.81) Pain - Right/Left: Right Pain - part of body: Knee     Time: 4128-7867 PT Time Calculation (min) (ACUTE ONLY): 40 min  Charges:  $Therapeutic Exercise: 23-37 mins $Therapeutic Activity: 8-22 mins                 Ivar Drape 10/27/2020, 4:49 PM Samul Dada, PT MS Acute Rehab Dept. Number: [facility] [id number] and [facility] [phone]

## 2020-10-27 NOTE — Discharge Summary (Signed)
Patient ID: Joy Fitzgerald MRN: [id number] DOB/AGE: [birth year] 75 y.o.  Admit date: 10/24/2020 Discharge date: 10/27/2020  Admission Diagnoses:  Principal Problem:   Unilateral primary osteoarthritis, right knee Active Problems:   Status post right knee replacement   Discharge Diagnoses:  Same  Past Medical History:  Diagnosis Date  . CKD (chronic kidney disease)   . Diabetes mellitus without complication (HCC)   . Dyspnea   . Headache syndrome 06/25/2018  . Hypertension   . Neck pain   . 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 female who was admitted 10/24/2020 for operative treatment ofUnilateral primary osteoarthritis, right knee. Patient has severe unremitting pain that affects sleep, daily activities, and work/hobbies. After pre-op clearance the patient was taken to the operating room on 10/24/2020 and underwent  Procedure(s): RIGHT TOTAL KNEE ARTHROPLASTY.    Patient was given perioperative antibiotics:  Anti-infectives (From admission, onward)   Start     Dose/Rate Route Frequency Ordered Stop   10/25/20 0600  ceFAZolin (ANCEF) IVPB 2g/100 mL premix        2 g 200 mL/hr over 30 Minutes Intravenous On call to O.R. 10/24/20 1209 10/24/20 1439   10/24/20 2100  ceFAZolin (ANCEF) IVPB 1 g/50 mL premix        1 g 100 mL/hr over 30 Minutes Intravenous Every 6 hours 10/24/20 2001 10/25/20 0230       Patient was given sequential compression devices, early ambulation, and chemoprophylaxis to prevent DVT.  Patient benefited maximally from hospital stay and there were no complications.    Recent vital signs:  Patient Vitals for the past 24 hrs:  BP Temp Temp src Pulse Resp SpO2  10/27/20 0340 (!) 144/68 98.5 F (36.9 C) Oral 70 17 99 %  10/26/20 1947 (!) 145/72 98 F (36.7 C) Oral 67 18 100 %   10/26/20 1645 (!) 145/59 98 F (36.7 C) Oral (!) 58 17 96 %  10/26/20 0735 (!) 172/67 98.6 F (37 C) Oral 65 17 94 %     Recent laboratory studies:  Recent Labs    10/25/20 0429 10/26/20 0432  WBC 8.6 11.3*  HGB 9.2* 8.5*  HCT 27.5* 26.0*  PLT 212 210  NA 138  --   K 4.6  --   CL 107  --   CO2 25  --   BUN 20  --   CREATININE 1.62*  --   GLUCOSE 95  --   CALCIUM 9.3  --      Discharge Medications:   Allergies as of 10/27/2020      Reactions   Codeine Nausea And Vomiting      Medication List    STOP taking these medications   aspirin EC 81 MG tablet Replaced by: aspirin 81 MG chewable tablet   naproxen 500 MG tablet Commonly known as: NAPROSYN   oxyCODONE-acetaminophen 10-325 MG tablet Commonly known as: PERCOCET     TAKE these medications   ALPRAZolam 1 MG tablet Commonly known as: XANAX Take 1 mg by mouth See admin instructions. Take 1 mg by mouth at bedtime and an additional 1 mg up to two times a day as needed for anxiety   aspirin 81 MG chewable tablet Chew 1 tablet (81 mg total) by mouth 2 (two) times daily. Replaces: aspirin EC 81 MG tablet   HYDROmorphone 2 MG  tablet Commonly known as: DILAUDID Take 1 tablet (2 mg total) by mouth every 4 (four) hours as needed for severe pain.   losartan 100 MG tablet Commonly known as: COZAAR Take 100 mg by mouth daily.   metFORMIN 500 MG tablet Commonly known as: GLUCOPHAGE Take 500 mg by mouth 2 (two) times daily with a meal.   methocarbamol 500 MG tablet Commonly known as: ROBAXIN Take 1 tablet (500 mg total) by mouth every 6 (six) hours as needed for muscle spasms.   nebivolol 10 MG tablet Commonly known as: BYSTOLIC Take 10 mg by mouth daily.   omeprazole 20 MG capsule Commonly known as: PRILOSEC Take 20 mg by mouth daily before breakfast.   pravastatin 40 MG tablet Commonly known as: PRAVACHOL Take 40 mg by mouth daily after lunch.            Durable Medical Equipment  (From  admission, onward)         Start     Ordered   10/24/20 2002  DME 3 n 1  Once        10/24/20 2001   10/24/20 2002  DME Walker rolling  Once       Question Answer Comment  Walker: With 5 Inch Wheels   Patient needs a walker to treat with the following condition Status post total right knee replacement      10/24/20 2001          Diagnostic Studies: DG Knee Right Port  Result Date: 10/24/2020 CLINICAL DATA:  Postop total knee replacement EXAM: PORTABLE RIGHT KNEE - 1-2 VIEW COMPARISON:  None. FINDINGS: The patient is status post right total knee arthroplasty. No periprosthetic lucency or fracture is identified. A joint effusion with subcutaneous emphysema seen. Overlying surgical staples are present. IMPRESSION: Status post right total knee arthroplasty without acute complication. Electronically Signed   By: Jonna Clark M.D.   On: 10/24/2020 17:21    Disposition: Discharge disposition: 03-Skilled Nursing Facility          Follow-up Information    Kathryne Hitch, MD Follow up in 2 week(s).   Specialty: Orthopedic Surgery Contact information: [address] [place] [address] [phone]        [facility] Follow up.   Specialty: Home Health Services Why: home health PT services will be provided by [facility], start of care with 48 hours post discharge  Contact information: [address] [place] [address] [phone]                Signed: Kathryne Hitch 10/27/2020, 6:39 AM

## 2020-10-28 DIAGNOSIS — M1711 Unilateral primary osteoarthritis, right knee: Secondary | ICD-10-CM | POA: Diagnosis not present

## 2020-10-28 LAB — URINALYSIS, ROUTINE W REFLEX MICROSCOPIC
Bilirubin Urine: NEGATIVE
Glucose, UA: 50 mg/dL — AB
Ketones, ur: NEGATIVE mg/dL
Leukocytes,Ua: NEGATIVE
Nitrite: NEGATIVE
Protein, ur: 100 mg/dL — AB
Specific Gravity, Urine: 1.016 (ref 1.005–1.030)
pH: 5 (ref 5.0–8.0)

## 2020-10-28 NOTE — Plan of Care (Signed)

## 2020-10-28 NOTE — Progress Notes (Signed)
Physical Therapy Treatment Patient Details Name: Joy Fitzgerald MRN: [id number] DOB: September 21, 1945 Today's Date: 10/28/2020    History of Present Illness Pt is a 75 yo female admitted for elective R TKA on 3/22. 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  Pt required max cues for participation and progression of activity.  Able to progress to short bout of gt training from bed to door with close chair follow.  Will continue to follow during acute stay to maximize functional gains.    Follow Up Recommendations  Follow surgeon's recommendation for DC plan and follow-up therapies;SNF     Equipment Recommendations  Rolling walker with 5" wheels    Recommendations for Other Services       Precautions / Restrictions Precautions Precautions: Knee Restrictions Weight Bearing Restrictions: Yes RLE Weight Bearing: Weight bearing as tolerated    Mobility  Bed Mobility Overal bed mobility: Needs Assistance Bed Mobility: Supine to Sit     Supine to sit: Min assist     General bed mobility comments: Min assistance to manage R LE to edge of bed.    Transfers Overall transfer level: Needs assistance Equipment used: Rolling walker (2 wheeled) Transfers: Sit to/from Stand Sit to Stand: Mod assist         General transfer comment: Cues for hand placement and increased time to rise into standing against gravity.  Performed from edge of bed and bed side commode.  Poor eccentric load returning to seated position.  Ambulation/Gait Ambulation/Gait assistance: Mod assist;+2 safety/equipment (for close chair follow.) Gait Distance (Feet): 4 Feet (+ 15 ft) Assistive device: Rolling walker (2 wheeled) Gait Pattern/deviations: Step-to pattern;Trunk flexed;Antalgic;Shuffle;Decreased weight shift to right;Decreased stance time - right;Decreased stride length     General Gait Details: Cues for sequencing and  forward gaze this session.  Pt required assistance to keep hands on RW and good posture.  Pt also required assistance for weight shifting and safety throughout session.  Increased pain with movement but able to progress to gt training this session with max cues for encouragement.   Stairs             Wheelchair Mobility    Modified Rankin (Stroke Patients Only)       Balance Overall balance assessment: Needs assistance Sitting-balance support: Feet supported Sitting balance-Leahy Scale: Good Sitting balance - Comments: pt used bilat UE to support self to offweight R LE due to pain     Standing balance-Leahy Scale: Poor                              Cognition Arousal/Alertness: Awake/alert;Lethargic Behavior During Therapy: Anxious Overall Cognitive Status: Within Functional Limits for tasks assessed                                 General Comments: declines OOB      Exercises General Exercises - Lower Extremity Ankle Circles/Pumps: AROM;Both;10 reps;Supine Quad Sets: AROM;Right;10 reps;Supine Heel Slides: AAROM;Right;5 reps;Supine Hip ABduction/ADduction: AROM;Right;10 reps;Supine Straight Leg Raises: AAROM;Right;5 reps;Supine    General Comments        Pertinent Vitals/Pain Pain Assessment: Faces Faces Pain Scale: Hurts even more Pain Location: R knee with bending Pain Descriptors / Indicators: Operative site guarding;Grimacing;Guarding Pain Intervention(s): Monitored during session;Repositioned;Ice applied    Home  Living                      Prior Function            PT Goals (current goals can now be found in the care plan section) Acute Rehab PT Goals Patient Stated Goal: stop the pain Potential to Achieve Goals: Good Progress towards PT goals: Progressing toward goals    Frequency    7X/week      PT Plan Current plan remains appropriate    Co-evaluation              AM-PAC PT "6 Clicks"  Mobility   Outcome Measure  Help needed turning from your back to your side while in a flat bed without using bedrails?: A Little Help needed moving from lying on your back to sitting on the side of a flat bed without using bedrails?: A Little Help needed moving to and from a bed to a chair (including a wheelchair)?: A Little Help needed standing up from a chair using your arms (e.g., wheelchair or bedside chair)?: A Little Help needed to walk in hospital room?: A Lot Help needed climbing 3-5 steps with a railing? : Total 6 Click Score: 15    End of Session Equipment Utilized During Treatment: Gait belt Activity Tolerance: Patient limited by pain Patient left: in bed;with call bell/phone within reach;with bed alarm set Nurse Communication: Mobility status PT Visit Diagnosis: Pain;Difficulty in walking, not elsewhere classified (R26.2);Muscle weakness (generalized) (M62.81) Pain - Right/Left: Right Pain - part of body: Knee     Time: 1130-1156 PT Time Calculation (min) (ACUTE ONLY): 26 min  Charges:  $Gait Training: 8-22 mins $Therapeutic Exercise: 8-22 mins                     Bonney Leitz , PTA Acute Rehabilitation Services Pager [phone] Office [phone]     Joy Fitzgerald 10/28/2020, 1:20 PM

## 2020-10-28 NOTE — TOC Progression Note (Signed)
Transition of Care [facility]) - Progression Note    Patient Details  Name: Joy Fitzgerald MRN: [id number] Date of Birth: 1946-05-09  Transition of Care [facility]) CM/SW Contact  Terrial Rhodes, LCSWA Phone Number: 10/28/2020, 3:30 PM  Clinical Narrative:     Plan to DC to [facility] on Monday if medically ready. Insurance authorization is pending. Reference number is #[id number].  CSW will continue to follow.    Expected Discharge Plan: Skilled Nursing Facility Barriers to Discharge: Insurance Authorization,No SNF bed  Expected Discharge Plan and Services Expected Discharge Plan: Skilled Nursing Facility   Discharge Planning Services: CM Consult   Living arrangements for the past 2 months: Single Family Home Expected Discharge Date: 10/27/20                         HH Arranged: PT HH [facility]: [facility] Date HH Agency Contacted: 10/25/20 Time HH Agency Contacted: 1052 Representative spoke with at [facility] Agency: [place]   Social Determinants of Health (SDOH) Interventions    Readmission Risk Interventions No flowsheet data found.

## 2020-10-28 NOTE — Progress Notes (Signed)
Patient ID: Joy Fitzgerald, female   DOB: 06/26/1946, 75 y.o.   MRN: [id number] Patient is status post right total knee arthroplasty.  She is requiring increased time with therapy most likely will need skilled nursing on Monday.

## 2020-10-28 NOTE — Progress Notes (Signed)
Physical Therapy Treatment Patient Details Name: Joy Fitzgerald MRN: [id number] DOB: May 11, 1946 Today's Date: 10/28/2020    History of Present Illness Pt is a 75 yo female admitted for elective R TKA on 3/22. 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  Focused on gt progression.  Left patient sitting on commode with RN to void.  Continue to follow during acute stay to improve functional mobility.      Follow Up Recommendations  Follow surgeon's recommendation for DC plan and follow-up therapies;SNF     Equipment Recommendations  Rolling walker with 5" wheels    Recommendations for Other Services       Precautions / Restrictions Precautions Precautions: Knee Restrictions Weight Bearing Restrictions: Yes RLE Weight Bearing: Weight bearing as tolerated    Mobility  Bed Mobility Overal bed mobility: Needs Assistance Bed Mobility: Supine to Sit     Supine to sit: Min assist     General bed mobility comments: Min assistance to manage R LE to edge of bed.    Transfers Overall transfer level: Needs assistance Equipment used: Rolling walker (2 wheeled) Transfers: Sit to/from Stand Sit to Stand: Mod assist         General transfer comment: Cues for hand placement and increased time to rise into standing against gravity.  Performed from edge of bed.Poor eccentric load continues returning to seated position.  Ambulation/Gait Ambulation/Gait assistance: Mod assist Gait Distance (Feet): 24 Feet Assistive device: Rolling walker (2 wheeled) Gait Pattern/deviations: Step-to pattern;Trunk flexed;Antalgic;Shuffle;Decreased weight shift to right;Decreased stance time - right;Decreased stride length     General Gait Details: Cues for sequencing and forward gaze this session.  Pt required assistance to keep hands on RW and good posture.  Pt also required assistance for weight shifting and safety throughout  session.  Increased pain with movement but able to progress to gt training this session with max cues for encouragement.   Stairs             Wheelchair Mobility    Modified Rankin (Stroke Patients Only)       Balance Overall balance assessment: Needs assistance Sitting-balance support: Feet supported Sitting balance-Leahy Scale: Good Sitting balance - Comments: pt used bilat UE to support self to offweight R LE due to pain     Standing balance-Leahy Scale: Poor                              Cognition Arousal/Alertness: Awake/alert Behavior During Therapy: Anxious Overall Cognitive Status: Within Functional Limits for tasks assessed                                 General Comments: declines OOB      Exercises Total Joint Exercises Goniometric ROM: 10-55 R knee.     General Comments        Pertinent Vitals/Pain Pain Assessment: Faces Faces Pain Scale: Hurts little more Pain Location: R knee Pain Descriptors / Indicators: Operative site guarding;Grimacing;Guarding Pain Intervention(s): Monitored during session;Repositioned;Ice applied    Home Living                      Prior Function            PT Goals (current goals can now be found in the care plan  section) Acute Rehab PT Goals Patient Stated Goal: stop the pain Potential to Achieve Goals: Good Progress towards PT goals: Progressing toward goals    Frequency    7X/week      PT Plan Current plan remains appropriate    Co-evaluation              AM-PAC PT "6 Clicks" Mobility   Outcome Measure  Help needed turning from your back to your side while in a flat bed without using bedrails?: A Little Help needed moving from lying on your back to sitting on the side of a flat bed without using bedrails?: A Little Help needed moving to and from a bed to a chair (including a wheelchair)?: A Little Help needed standing up from a chair using your arms  (e.g., wheelchair or bedside chair)?: A Little Help needed to walk in hospital room?: A Lot Help needed climbing 3-5 steps with a railing? : Total 6 Click Score: 15    End of Session Equipment Utilized During Treatment: Gait belt Activity Tolerance: Patient limited by pain Patient left: on commode ;with call bell/phone within reach; RN in room.  Nurse Communication: Mobility status PT Visit Diagnosis: Pain;Difficulty in walking, not elsewhere classified (R26.2);Muscle weakness (generalized) (M62.81) Pain - Right/Left: Right Pain - part of body: Knee     Time: 3016-0109 PT Time Calculation (min) (ACUTE ONLY): 25 min  Charges:  $Gait Training: 8-22 mins  $Therapeutic Activity: 8-22 mins                     Joy Fitzgerald , PTA Acute Rehabilitation Services Pager [phone] Office [phone]     Joy Fitzgerald [name] 10/28/2020, 4:45 PM

## 2020-10-28 NOTE — Progress Notes (Addendum)
Patient has had breakfast and daily morning medications. Did complain of pain to the incision wound (check MAR for interventions). She is currently resting in room. Will continue to monitor the patient and assess their needs.

## 2020-10-29 DIAGNOSIS — M1711 Unilateral primary osteoarthritis, right knee: Secondary | ICD-10-CM | POA: Diagnosis not present

## 2020-10-29 LAB — URINE CULTURE
Culture: NO GROWTH
Special Requests: NORMAL

## 2020-10-29 NOTE — Progress Notes (Signed)
Physical Therapy Treatment Patient Details Name: Joy Fitzgerald MRN: [id number] DOB: 1945-08-29 Today's Date: 10/29/2020    History of Present Illness Pt is a 75 yo female admitted for elective R TKA on 3/22. 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, arthritis, CKD, DM    PT Comments    Continuing work on functional mobility and activity tolerance;  Still very painful, but able to participate with pain meds recently onboard and lots of encouragement; small gains in knee flexion ROM, and needing less assist with progressive ambulation, with noted step through pattern emerging   Follow Up Recommendations  Follow surgeon's recommendation for DC plan and follow-up therapies;SNF     Equipment Recommendations  Rolling walker with 5" wheels    Recommendations for Other Services       Precautions / Restrictions Precautions Precautions: Knee Precaution Booklet Issued: Yes (comment) Precaution Comments: Little to no carryover of knee prec; knee section of bed was elevated upon arrival Required Braces or Orthoses: Knee Immobilizer - Right Knee Immobilizer - Right: Discontinue post op day 2 Restrictions RLE Weight Bearing: Weight bearing as tolerated    Mobility  Bed Mobility Overal bed mobility: Needs Assistance Bed Mobility: Supine to Sit     Supine to sit: Min assist     General bed mobility comments: Cues for technqiue, and to boost hips to EOB in prep for getting up; Min handheld assist to pull to sit and used bedpads to square off hip at EOB    Transfers Overall transfer level: Needs assistance Equipment used: Rolling walker (2 wheeled) Transfers: Sit to/from Stand Sit to Stand: Mod assist         General transfer comment: Cues for hand placement and increased time to rise into standing against gravity. Stood from EOB and from [facility]; Cues to use UEs to help support and get control of descent to sit  Ambulation/Gait Ambulation/Gait assistance: Min  Chemical engineer (Feet): 24 Feet Assistive device: Rolling walker (2 wheeled) Gait Pattern/deviations: Step-through pattern;Trunk flexed (emerging step-through pattern)     General Gait Details: Cues for sequencing and forward gaze this session.  Pt required assistance to keep hands on RW and good posture.  Pt also required assistance for weight shifting and safety throughout session.  Increased pain with movement but able to continue with gait training this session with max cues for encouragement.   Stairs             Wheelchair Mobility    Modified Rankin (Stroke Patients Only)       Balance     Sitting balance-Leahy Scale: Good       Standing balance-Leahy Scale: Poor                              Cognition Arousal/Alertness: Awake/alert Behavior During Therapy: Anxious Overall Cognitive Status: Within Functional Limits for tasks assessed                                        Exercises Total Joint Exercises Quad Sets: AROM;10 reps;Right (weak contraction) Short Arc Quad: AAROM;Right;10 reps Heel Slides: AAROM;Right;10 reps Goniometric ROM: Approx 8-60 deg R knee    General Comments        Pertinent Vitals/Pain Pain Assessment: Faces Pain Score: 7  Faces Pain Scale: Hurts little more Pain Location: R knee Pain  Descriptors / Indicators: Operative site guarding;Grimacing;Guarding Pain Intervention(s): Monitored during session;Premedicated before session;Repositioned    Home Living                      Prior Function            PT Goals (current goals can now be found in the care plan section) Acute Rehab PT Goals Patient Stated Goal: stop the pain PT Goal Formulation: With patient Time For Goal Achievement: 11/08/20 Potential to Achieve Goals: Good Progress towards PT goals: Progressing toward goals    Frequency    7X/week      PT Plan Current plan remains appropriate    Co-evaluation               AM-PAC PT "6 Clicks" Mobility   Outcome Measure  Help needed turning from your back to your side while in a flat bed without using bedrails?: A Little Help needed moving from lying on your back to sitting on the side of a flat bed without using bedrails?: A Little Help needed moving to and from a bed to a chair (including a wheelchair)?: A Little Help needed standing up from a chair using your arms (e.g., wheelchair or bedside chair)?: A Little Help needed to walk in hospital room?: A Lot Help needed climbing 3-5 steps with a railing? : Total 6 Click Score: 15    End of Session Equipment Utilized During Treatment: Gait belt Activity Tolerance: Patient limited by pain Patient left: in chair;with call bell/phone within reach;with chair alarm set Nurse Communication: Mobility status PT Visit Diagnosis: Pain;Difficulty in walking, not elsewhere classified (R26.2);Muscle weakness (generalized) (M62.81) Pain - Right/Left: Right Pain - part of body: Knee     Time: 5329-9242 PT Time Calculation (min) (ACUTE ONLY): 37 min  Charges:  $Gait Training: 8-22 mins $Therapeutic Exercise: 8-22 mins                     Van Clines, PT  Acute Rehabilitation Services Pager [phone] Office [phone]    Levi Aland 10/29/2020, 1:54 PM

## 2020-10-29 NOTE — Plan of Care (Signed)

## 2020-10-29 NOTE — TOC Progression Note (Signed)
Transition of Care [facility]) - Progression Note    Patient Details  Name: Joy Fitzgerald MRN: [id number] Date of Birth: August 21, 1945  [facility] CM/SW Contact  Terrial Rhodes, [place] Phone Number: 10/29/2020, 1:30 PM  Clinical Narrative:     Patient has SNF bed at [facility]. Insurance authorization is still pending. CSW will call tomorrow to follow up on status on patients insurance authorization. CSW will continue to follow and assist with discharge planning needs.    Expected Discharge Plan: Skilled Nursing Facility Barriers to Discharge: Insurance Authorization,No SNF bed  Expected Discharge Plan and Services Expected Discharge Plan: Skilled Nursing Facility   Discharge Planning Services: CM Consult   Living arrangements for the past 2 months: Single Family Home Expected Discharge Date: 10/27/20                         HH Arranged: PT [facility]: [facility] Date HH Agency Contacted: 10/25/20 Time HH Agency Contacted: 1052 Representative spoke with at [facility] Agency: [place]   Social Determinants of Health (SDOH) Interventions    Readmission Risk Interventions No flowsheet data found.

## 2020-10-29 NOTE — Progress Notes (Signed)
Patient ID: Joy Fitzgerald, female   DOB: 1946-06-22, 75 y.o.   MRN: [id number] Patient is status post total knee arthroplasty she continues to make slow progress with therapy anticipate discharge to skilled nursing.  Urinalysis obtained yesterday does show rare bacteria patient states that her urination is asymptomatic.  Encouraged fluid intake

## 2020-10-30 DIAGNOSIS — M1711 Unilateral primary osteoarthritis, right knee: Secondary | ICD-10-CM | POA: Diagnosis not present

## 2020-10-30 NOTE — Progress Notes (Signed)
Physical Therapy Treatment Patient Details Name: Joy Fitzgerald MRN: [id number] DOB: Nov 14, 1945 Today's Date: 10/30/2020    History of Present Illness Pt is a 74 yo female admitted for elective R TKA on 3/22. 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 Pt was unable to ambulate today and required significant assist to complete std pvt transfer to commode and chair. Pt self- limiting and refusing to participate in therapy stating "No one understands how much pain i'm in. I can't do anything." Pt educated extensively on the importance of moving and doing R LE exercises as R knee is swollen and stiff. Acute PT to cont to follow. Continue to recommend SNF.    Follow Up Recommendations  Follow surgeon's recommendation for DC plan and follow-up therapies;SNF     Equipment Recommendations  Rolling walker with 5" wheels    Recommendations for Other Services       Precautions / Restrictions Precautions Precautions: Knee Precaution Booklet Issued: Yes (comment) Precaution Comments: no carryover of knee prec Required Braces or Orthoses: Knee Immobilizer - Right Knee Immobilizer - Right: Discontinue post op day 2 Restrictions Weight Bearing Restrictions: Yes RLE Weight Bearing: Weight bearing as tolerated    Mobility  Bed Mobility Overal bed mobility: Needs Assistance Bed Mobility: Supine to Sit     Supine to sit: Mod assist     General bed mobility comments: modA for trunk elevation due to pt self limiting habits and minimal efforts, minA for R LE management, increased time, pt crying the whole time    Transfers Overall transfer level: Needs assistance Equipment used: Rolling walker (2 wheeled) Transfers: Sit to/from [employer] Sit to Stand: Mod assist Stand pivot transfers: Mod assist       General transfer comment: pt with poor effort,  modA to power up with max directional verbal cues to std up pushing up from bed and transitioning hands to RW. Pt crying t/o transfer to [facility] and then ultimately refused to get into chair. Valiant effort on PTs part to have pt stand erect and try to put weight on R LE for safe std pvt to chair however pt kept leaning and folding onto elbows over the walker refusing to mobilize despite max encouragement and tactile cues to promote upright posture and stepping. chair ultimately brought up behind patient.  Ambulation/Gait                 Stairs             Wheelchair Mobility    Modified Rankin (Stroke Patients Only)       Balance Overall balance assessment: Needs assistance Sitting-balance support: Feet supported Sitting balance-Leahy Scale: Good Sitting balance - Comments: pt used bilat UE to support self to offweight R LE due to pain   Standing balance support: Bilateral upper extremity supported;During functional activity Standing balance-Leahy Scale: Poor Standing balance comment: dependent on R LE due to limited R LE wbing due ot pain                            Cognition Arousal/Alertness: Awake/alert Behavior During Therapy: Anxious Overall Cognitive Status: Impaired/Different from baseline  General Comments: pt crying in pain but also appears to have delayed processing, mild confusion, pt stating she has to go to the bathroom but she'll only go on the toliet but refusing to get up to go to the toliet, pt having a hard time following commands but continued to perseverate on R Knee pain and "I can't do anything"      Exercises Total Joint Exercises Quad Sets:  (weak contraction) Heel Slides: AAROM;Right;10 reps Long Arc Quad: AAROM;Right;10 reps;Seated Knee Flexion: AAROM;Right;10 reps;Seated Goniometric ROM: could achieve about 70 deg of flexion in sitting    General Comments General comments (skin  integrity, edema, etc.): spoke extnesively with patient regarding why it was so important to move and complete R LE exercises as her knee is swollen and stiff cuasing more pain, pt not wanting to listen to PT and kept repeating "No one understands how bad this hurts. I can't do anything"      Pertinent Vitals/Pain Pain Assessment: Faces Faces Pain Scale: Hurts worst Pain Location: R knee Pain Descriptors / Indicators: Operative site guarding;Grimacing;Guarding Pain Intervention(s): Limited activity within patient's tolerance    Home Living                      Prior Function            PT Goals (current goals can now be found in the care plan section) Acute Rehab PT Goals Patient Stated Goal: stop the pain PT Goal Formulation: With patient Time For Goal Achievement: 11/08/20 Potential to Achieve Goals: Good Progress towards PT goals: Progressing toward goals    Frequency    7X/week      PT Plan Current plan remains appropriate    Co-evaluation              AM-PAC PT "6 Clicks" Mobility   Outcome Measure  Help needed turning from your back to your side while in a flat bed without using bedrails?: A Little Help needed moving from lying on your back to sitting on the side of a flat bed without using bedrails?: A Little Help needed moving to and from a bed to a chair (including a wheelchair)?: A Little Help needed standing up from a chair using your arms (e.g., wheelchair or bedside chair)?: A Little Help needed to walk in hospital room?: A Lot Help needed climbing 3-5 steps with a railing? : Total 6 Click Score: 15    End of Session Equipment Utilized During Treatment: Gait belt Activity Tolerance: Patient limited by pain Patient left: in chair;with call bell/phone within reach;with chair alarm set Nurse Communication: Mobility status PT Visit Diagnosis: Pain;Difficulty in walking, not elsewhere classified (R26.2);Muscle weakness (generalized)  (M62.81) Pain - Right/Left: Right Pain - part of body: Knee     Time: 4098-1191 PT Time Calculation (min) (ACUTE ONLY): 27 min  Charges:  $Therapeutic Exercise: 8-22 mins $Therapeutic Activity: 8-22 mins                     Lewis Shock, PT, DPT Acute Rehabilitation Services Pager #: [phone] Office #: [phone]    [name] Hansen 10/30/2020, 2:40 PM

## 2020-10-30 NOTE — Progress Notes (Signed)
Report given to RN at [facility]. VSS on RA at discharge. All belongings with pt at discharge.

## 2020-10-30 NOTE — TOC Transition Note (Signed)
Transition of Care [facility]) - CM/SW Discharge Note   Patient Details  Name: Joy Fitzgerald MRN: [id number] Date of Birth: 1945/12/09  Transition of Care [facility]) CM/SW Contact:  Erin Sons, LCSW Phone Number: 10/30/2020, 1:32 PM   Clinical Narrative:     Patient will DC to: [facility] Anticipated DC date: 10/30/20 Family notified: Pt sister Transport by: Sharin Mons   Per MD patient ready for DC to [facility]. RN, patient, patient's family, and facility notified of DC. Discharge Summary and FL2 sent to facility. RN to call report prior to discharge [phone] Ask to speak to [facility] Nurse Room 504 A). DC packet on chart. Ambulance transport requested for patient.   CSW will sign off for now as social work intervention is no longer needed. Please consult [place] again if new needs arise.   Final next level of care: Skilled Nursing Facility Barriers to Discharge: No Barriers Identified   Patient Goals and CMS Choice Patient states their goals for this hospitalization and ongoing recovery are:: Get out of the hospital CMS Medicare.gov Compare Post Acute Care list provided to:: Patient Choice offered to / list presented to : Patient  Discharge Placement              Patient chooses bed at: [facility] Patient to be transferred to facility by: [facility] Name of family member notified: Sister Patient and family notified of of transfer: 10/30/20  Discharge Plan and Services   Discharge Planning Services: CM Consult                      HH Arranged: PT HH Agency: [facility] Date HH Agency Contacted: 10/25/20 Time HH Agency Contacted: 1052 Representative spoke with at [facility] Agency: [place]  Social Determinants of Health (SDOH) Interventions     Readmission Risk Interventions No flowsheet data found.

## 2020-10-30 NOTE — Discharge Summary (Signed)
Patient ID: Joy Fitzgerald MRN: [id number] DOB/AGE: Feb 14, 1946 75 y.o.  Admit date: 10/24/2020 Discharge date: 10/30/2020  Admission Diagnoses:  Principal Problem:   Unilateral primary osteoarthritis, right knee Active Problems:   Status post right knee replacement   Discharge Diagnoses:  Same  Past Medical History:  Diagnosis Date  . CKD (chronic kidney disease)   . Diabetes mellitus without complication (HCC)   . Dyspnea   . Headache syndrome 06/25/2018  . Hypertension   . Neck pain   . 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 female who was admitted 10/24/2020 for operative treatment ofUnilateral primary osteoarthritis, right knee. Patient has severe unremitting pain that affects sleep, daily activities, and work/hobbies. After pre-op clearance the patient was taken to the operating room on 10/24/2020 and underwent  Procedure(s): RIGHT TOTAL KNEE ARTHROPLASTY.    Patient was given perioperative antibiotics:  Anti-infectives (From admission, onward)   Start     Dose/Rate Route Frequency Ordered Stop   10/25/20 0600  ceFAZolin (ANCEF) IVPB 2g/100 mL premix        2 g 200 mL/hr over 30 Minutes Intravenous On call to O.R. 10/24/20 1209 10/24/20 1439   10/24/20 2100  ceFAZolin (ANCEF) IVPB 1 g/50 mL premix        1 g 100 mL/hr over 30 Minutes Intravenous Every 6 hours 10/24/20 2001 10/25/20 0230       Patient was given sequential compression devices, early ambulation, and chemoprophylaxis to prevent DVT.  Patient benefited maximally from hospital stay and there were no complications.    Recent vital signs:  Patient Vitals for the past 24 hrs:  BP Temp Temp src Pulse Resp SpO2  10/29/20 2009 140/64 (!) 97.3 F (36.3 C) Oral (!) 57 14 100 %  10/29/20 1451 (!) 100/54 97.7 F (36.5 C) Oral 89 18 98 %   10/29/20 0818 109/64 98 F (36.7 C) Oral (!) 58 18 92 %     Recent laboratory studies: No results for input(s): WBC, HGB, HCT, PLT, NA, K, CL, CO2, BUN, CREATININE, GLUCOSE, INR, CALCIUM in the last 72 hours.  Invalid input(s): PT, 2   Discharge Medications:   Allergies as of 10/30/2020      Reactions   Codeine Nausea And Vomiting      Medication List    STOP taking these medications   aspirin EC 81 MG tablet Replaced by: aspirin 81 MG chewable tablet   naproxen 500 MG tablet Commonly known as: NAPROSYN   oxyCODONE-acetaminophen 10-325 MG tablet Commonly known as: PERCOCET     TAKE these medications   ALPRAZolam 1 MG tablet Commonly known as: XANAX Take 1 mg by mouth See admin instructions. Take 1 mg by mouth at bedtime and an additional 1 mg up to two times a day as needed for anxiety   aspirin 81 MG chewable tablet Chew 1 tablet (81 mg total) by mouth 2 (two) times daily. Replaces: aspirin EC 81 MG tablet   HYDROmorphone 2 MG tablet Commonly known as: DILAUDID Take 1 tablet (2 mg total) by mouth every 4 (four) hours as needed for severe pain.   losartan 100 MG tablet Commonly known as: COZAAR Take 100 mg by mouth daily.   metFORMIN 500 MG tablet Commonly known as: GLUCOPHAGE Take 500 mg by mouth 2 (two) times daily with a meal.   methocarbamol  500 MG tablet Commonly known as: ROBAXIN Take 1 tablet (500 mg total) by mouth every 6 (six) hours as needed for muscle spasms.   nebivolol 10 MG tablet Commonly known as: BYSTOLIC Take 10 mg by mouth daily.   omeprazole 20 MG capsule Commonly known as: PRILOSEC Take 20 mg by mouth daily before breakfast.   pravastatin 40 MG tablet Commonly known as: PRAVACHOL Take 40 mg by mouth daily after lunch.            Durable Medical Equipment  (From admission, onward)         Start     Ordered   10/24/20 2002  DME 3 n 1  Once        10/24/20 2001   10/24/20 2002  DME Walker rolling  Once       Question  Answer Comment  Walker: With 5 Inch Wheels   Patient needs a walker to treat with the following condition Status post total right knee replacement      10/24/20 2001          Diagnostic Studies: DG Knee Right Port  Result Date: 10/24/2020 CLINICAL DATA:  Postop total knee replacement EXAM: PORTABLE RIGHT KNEE - 1-2 VIEW COMPARISON:  None. FINDINGS: The patient is status post right total knee arthroplasty. No periprosthetic lucency or fracture is identified. A joint effusion with subcutaneous emphysema seen. Overlying surgical staples are present. IMPRESSION: Status post right total knee arthroplasty without acute complication. Electronically Signed   By: Jonna Clark M.D.   On: 10/24/2020 17:21    Disposition: Discharge disposition: 03-Skilled Nursing Facility          Contact information for follow-up providers    Kathryne Hitch, MD Follow up in 2 week(s).   Specialty: Orthopedic Surgery Contact information: [address] [place] [address] [phone]        [facility] Follow up.   Specialty: Home Health Services Why: home health PT services will be provided by [facility], start of care with 48 hours post discharge  Contact information: [address] [place] [address] [phone]            Contact information for after-discharge care    Destination    [facility] Preferred SNF .   Service: Skilled Nursing Contact information: [address] [place] [address] [phone]                   Signed: Kathryne Hitch 10/30/2020, 7:34 AM

## 2020-10-30 NOTE — Progress Notes (Signed)
Patient ID: Joy Fitzgerald, female   DOB: Jan 01, 1946, 75 y.o.   MRN: [id number] The patient is now postop day #6 status post a right total knee arthroplasty.  Her mobility has been limited secondary to her pain tolerance.  She was on high-dose chronic pain medications/narcotics prior to surgery.  She has had a very difficult time due to this and it has been recommended that she will benefit from short-term skilled nursing placement.  That is still the recommendation given her limitations in mobility.  She is otherwise medically stable.  Her right knee is stable.  She can be discharged to skilled nursing when insurance approval is obtained and a bed is available.  I will still put in for discharge today if that can happen.

## 2020-10-30 NOTE — TOC Progression Note (Addendum)
Transition of Care [facility]) - Progression Note    Patient Details  Name: Joy Fitzgerald MRN: [id number] Date of Birth: 07/02/1946  [facility] CM/SW Contact  Erin Sons, [place] Phone Number: 10/30/2020, 9:25 AM  Clinical Narrative:     CSW checked [employer] for SNF auth; Auth still pending at this time.   3419: CSW informed that [facility] is requesting additional clinicals. Clinicals have been resent. Possible peer review needed. CSW will continue to follow-up on auth.   1230: Auth received Auth ID [id number]  Approved from 3/28-3/30  Expected Discharge Plan: Skilled Nursing Facility Barriers to Discharge: Insurance Authorization,No SNF bed  Expected Discharge Plan and Services Expected Discharge Plan: Skilled Nursing Facility   Discharge Planning Services: CM Consult   Living arrangements for the past 2 months: Single Family Home Expected Discharge Date: 10/30/20                         HH Arranged: PT HH [facility]: [facility] Date HH Agency Contacted: 10/25/20 Time HH Agency Contacted: 1052 Representative spoke with at [facility] Agency: [place]   Social Determinants of Health (SDOH) Interventions    Readmission Risk Interventions No flowsheet data found.

## 2020-11-07 ENCOUNTER — Encounter: Payer: Self-pay | Admitting: Orthopaedic Surgery

## 2020-11-07 ENCOUNTER — Ambulatory Visit (INDEPENDENT_AMBULATORY_CARE_PROVIDER_SITE_OTHER): Payer: Medicare HMO | Admitting: Orthopaedic Surgery

## 2020-11-07 DIAGNOSIS — Z96651 Presence of right artificial knee joint: Secondary | ICD-10-CM

## 2020-11-07 NOTE — Progress Notes (Signed)
The patient is 2 weeks status post a right total knee arthroplasty.  She says it hurts quite a bit and she has a lot of pain.  She is walking with a walker and a wheelchair.  She feels like the skilled nursing facility is an attending to her needs for her.  She says she is not eating well and is lost weight and they are not giving her enough narcotic pain medicine so her blood pressure has been high.  She said that they have been able to flex her knee to 90 degrees.  In the office today she lacks full extension by 3 to 5 degrees.  I can flex her to 90 degrees.  Her calf is soft.  The knee is swollen to be expected.  The staples are intact and there is no evidence of infection.  We will remove the staples today and placed Steri-Strips.  She needs to work on aggressive motion of that knee.  Hopefully when she is discharged from skilled nursing she can have home health set up for her or outpatient physical therapy because she knows it is essential that she get her knee moving.  I will see her back in 4 weeks to see how she is doing overall but no x-rays are needed.

## 2020-11-13 ENCOUNTER — Telehealth: Payer: Self-pay

## 2020-11-13 NOTE — Telephone Encounter (Signed)
LMOM stating I was returning call but said that if the question had anything to do with Dr. Magnus Ivan and his sister being released from facility; then Dr. Magnus Ivan could actually not do anything about that, has to be the doctor at the facility that releases patient

## 2020-11-13 NOTE — Telephone Encounter (Signed)
Patients brother Apolinar Junes called he has a question regarding when patient can leave rehab call back:[phone]

## 2020-11-22 ENCOUNTER — Other Ambulatory Visit: Payer: Self-pay

## 2020-11-22 ENCOUNTER — Telehealth: Payer: Self-pay | Admitting: Orthopaedic Surgery

## 2020-11-22 DIAGNOSIS — Z96651 Presence of right artificial knee joint: Secondary | ICD-10-CM

## 2020-11-22 NOTE — Telephone Encounter (Signed)
Probably outpatient at this point? I think she has been in a facility?

## 2020-11-22 NOTE — Telephone Encounter (Signed)
Patient called asked if (PT) will be coming to her home. Patient said she is doing fine and returned to her home  Dec 15, 2020. Patient said she have not been outside yet. Patient said she just wondered if (PT) will be coming  To her home?  The number to contact patient is    [phone]

## 2020-11-22 NOTE — Telephone Encounter (Signed)
I do think outpatient therapy would be the next step for her.  If there is no way for her to get outpatient therapy, see if home health PT can go there.  Thanks.

## 2020-11-22 NOTE — Telephone Encounter (Signed)
Sent order for outpatient PT

## 2020-11-28 ENCOUNTER — Ambulatory Visit: Payer: Medicare HMO | Attending: Orthopaedic Surgery

## 2020-11-28 ENCOUNTER — Other Ambulatory Visit: Payer: Self-pay

## 2020-11-28 DIAGNOSIS — M6281 Muscle weakness (generalized): Secondary | ICD-10-CM | POA: Diagnosis present

## 2020-11-28 DIAGNOSIS — R262 Difficulty in walking, not elsewhere classified: Secondary | ICD-10-CM

## 2020-11-28 DIAGNOSIS — G8929 Other chronic pain: Secondary | ICD-10-CM | POA: Insufficient documentation

## 2020-11-28 DIAGNOSIS — R2689 Other abnormalities of gait and mobility: Secondary | ICD-10-CM | POA: Diagnosis present

## 2020-11-28 DIAGNOSIS — M25561 Pain in right knee: Secondary | ICD-10-CM | POA: Insufficient documentation

## 2020-11-28 DIAGNOSIS — M25662 Stiffness of left knee, not elsewhere classified: Secondary | ICD-10-CM | POA: Insufficient documentation

## 2020-11-29 NOTE — Therapy (Signed)
[facility] Outpatient Rehabilitation [facility] [address] [place], [place], [address] Phone: [phone]   Fax:  [phone]  Physical Therapy Evaluation  Patient Details  Name: Joy Fitzgerald MRN: [id number] Date of Birth: January 23, 1946 Referring Provider (PT): Kathryne Hitch, MD   Encounter Date: 11/28/2020   PT End of Session - 11/29/20 0949    Visit Number 1    Number of Visits 17    Date for PT Re-Evaluation 02/03/21    Authorization Type [employer]IC[employer] HMO    Progress Note Due on Visit 10    PT Start Time 1617    PT Stop Time 1702    PT Time Calculation (min) 45 min    Activity Tolerance Patient tolerated treatment well    Behavior During Therapy [facility] for tasks assessed/performed           Past Medical History:  Diagnosis Date  . CKD (chronic kidney disease)   . Diabetes mellitus without complication (HCC)   . Dyspnea   . Headache syndrome 06/25/2018  . Hypertension   . Neck pain   . 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 APPENDECTOMY    . EYE SURGERY    . TOTAL KNEE ARTHROPLASTY Right 10/24/2020   Procedure: RIGHT TOTAL KNEE ARTHROPLASTY;  Surgeon: Kathryne Hitch, MD;  Location: [facility] OR;  Service: Orthopedics;  Laterality: Right;    There were no vitals filed for this visit.    Subjective Assessment - 11/28/20 1627    Subjective I feel like I'm going great. Pleased with her progress.    Patient Stated Goals To have good use of knee and to get out in the community/shop.    Currently in Pain? Yes    Pain Score 4     Pain Location Knee    Pain Orientation Right    Pain Descriptors / Indicators Aching    Pain Type Chronic pain    Pain Frequency Intermittent    Aggravating Factors  extended standing and walking    Pain Relieving Factors Pain meds, rest              [facility] PT Assessment - 11/29/20 0001      Assessment   Medical Diagnosis Status post right  knee replacement    Referring Provider (PT) Kathryne Hitch, MD    Onset Date/Surgical Date 10/24/20    Hand Dominance Right    Next MD Visit 12/05/20    Prior Therapy yes-SNF      Precautions   Precautions Knee   crossing legs     Restrictions   Weight Bearing Restrictions No      Balance Screen   Has the patient fallen in the past 6 months No      Home Environment   Living Environment Private residence    Living Arrangements Children    Type of Home Apartment    Home Access Ramped entrance    Home Layout One level    Home Equipment Walker - 2 wheels;Cane - single point;Shower seat;Hand held shower head      Prior Function   Level of Independence Independent    Vocation On disability    Leisure Go to [employer]   Overall Cognitive Status Within Functional Limits for tasks assessed      Observation/Other Assessments   Skin Integrity Healing midline R knee incision with minimal areas of scabbing  Focus on Therapeutic Outcomes (FOTO)  47% ability      Sensation   Light Touch Appears Intact      Posture/Postural Control   Posture/Postural Control Postural limitations    Postural Limitations Flexed trunk   flexed hips and knees     ROM / Strength   AROM / PROM / Strength AROM;Strength      AROM   Overall AROM Comments BUE and L LE are WFLs    AROM Assessment Site Knee    Right/Left Knee Right    Right Knee Extension -17    Right Knee Flexion 120      Strength   Overall Strength Comments Except for R shoulder for which pt reports a pinched nerve, BUE strength is WFLs    Strength Assessment Site Hip;Knee    Right/Left Hip Right;Left    Right Hip Flexion 3+/5    Right Hip Extension 3/5    Right Hip External Rotation  3+/5    Right Hip Internal Rotation 3+/5    Right Hip ABduction 3+/5    Right Hip ADduction 3+/5    Left Hip Flexion 3/5    Left Hip Extension 3/5    Left Hip External Rotation 3/5    Left Hip Internal Rotation 3+/5    Left  Hip ABduction 3/5    Left Hip ADduction 3+/5    Right/Left Knee Right;Left    Right Knee Flexion 4+/5    Right Knee Extension 4+/5    Left Knee Flexion 4+/5    Left Knee Extension 4+/5      Transfers   Transfers Sit to Stand;Stand to Sit    Sit to Stand 6: Modified independent (Device/Increase time)    Five time sit to stand comments  26.8 c armrests      Ambulation/Gait   Ambulation/Gait Yes    Ambulation/Gait Assistance 6: Modified independent (Device/Increase time)    Assistive device Rolling walker    Gait Pattern Step-through pattern    Gait velocity decreased pace      Balance   Balance Assessed Yes      Standardized Balance Assessment   Standardized Balance Assessment Timed Up and Go Test      Timed Up and Go Test   Normal TUG (seconds) 17.4                      Objective measurements completed on examination: See above findings.               PT Education - 11/29/20 0946    Education Details Eval findings, POC, HEP, recommendation for 2 pillows under heel when in recliner    Person(s) Educated Patient    Methods Explanation;Demonstration;Tactile cues;Verbal cues;Handout    Comprehension Verbalized understanding;Returned demonstration;Verbal cues required;Need further instruction;Tactile cues required            PT Short Term Goals - 11/29/20 1020      PT SHORT TERM GOAL #1   Title Pt will be Ind c an HEP with emphasis to improve r knee ext ROM    Baseline started on eval    Status New    Target Date 12/20/20      PT SHORT TERM GOAL #2   Title Pt will voice measures to assist with symptom management    Status New    Target Date 12/20/20      PT SHORT TERM GOAL #3   Title Asses Berg prior to transition to  LRAD    Status New    Target Date 12/20/20             PT Long Term Goals - 11/29/20 1021      PT LONG TERM GOAL #1   Title Pt will demonstrate increased R knee ext ROM to -3d or less for improve functional use of the  R knee and gait quality    Baseline -17d    Status New    Target Date 02/03/21      PT LONG TERM GOAL #2   Title Increase bilat hip strength to 4/5 and knee strength to 5/5 for improved balance, function, and safety    Baseline see flowsheets    Status New    Target Date 02/03/21      PT LONG TERM GOAL #3   Title Improve TUG time c or s an AD to 12 sec as demonstration of improved functional mobility    Baseline 17.4 sce c RW    Status New    Target Date 02/03/21      PT LONG TERM GOAL #4   Title Improve 5xSTS c armrests to 20 sec or less as demonstration of improved functional mobility    Baseline 26.8 sec    Status New    Target Date 02/03/21      PT LONG TERM GOAL #5   Title Improve pt's quality of gait to 1060ft c a minimal to no limp over the R LE c a LRAD for her to complete community ambulation Indly    Status New    Target Date 02/03/21      Additional Long Term Goals   Additional Long Term Goals Yes      PT LONG TERM GOAL #6   Title Pt will be Ind in a final HEP to maintain or progress achieved level of function    Status New    Target Date 02/03/21                  Plan - 11/29/20 0957    Clinical Impression Statement Pt presents to PT following a R TKR on 10/24/20. Pt received rehab for 2 weeks at a SNF prior to returning to home. Pt is ind c a RW although needed safety cues for turning, knee flexion ROM is excellent at 120d, while ext ROM is poor at -17d, and both LEs demonstrate weakness, R>L. Times for the functional tests for 5xSTS and the test were decreased. Pt will benefit from PT 2w8 for ROM, strengthening, and balance to optimize use of the R knee/LE for functional mobility.    Personal Factors and Comorbidities Age;Fitness;Past/Current Experience;Social Background;Comorbidity 1    Comorbidities DM    Examination-Activity Limitations Locomotion Level;Stairs;Transfers    Examination-Participation Restrictions Community Activity     Stability/Clinical Decision Making Stable/Uncomplicated    Clinical Decision Making Low    Rehab Potential Excellent    PT Frequency 2x / week    PT Duration 8 weeks    PT Treatment/Interventions ADLs/Self Care Home Management;Cryotherapy;Electrical Stimulation;Iontophoresis 4mg /ml Dexamethasone;Moist Heat;Ultrasound;Balance training;Therapeutic exercise;Therapeutic activities;Functional mobility training;Stair training;Gait training;Patient/family education;Manual techniques;Passive range of motion;Dry needling;Taping;Vasopneumatic Device;Joint Manipulations    PT Next Visit Plan Assess response to HEP. Check R knee ext ROM.    PT Home Exercise Plan [id number]    Consulted and Agree with Plan of Care Patient           Patient will benefit from skilled therapeutic intervention in order to improve the  following deficits and impairments:  Abnormal gait,Decreased range of motion,Difficulty walking,Decreased endurance,Decreased safety awareness,Decreased activity tolerance,Pain,Decreased balance,Decreased mobility,Decreased strength,Increased edema  Visit Diagnosis: Chronic pain of right knee  Difficulty in walking, not elsewhere classified  Other abnormalities of gait and mobility  Muscle weakness (generalized)  Stiffness of left knee, not elsewhere classified     Problem List Patient Active Problem List   Diagnosis Date Noted  . 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 Unilateral primary osteoarthritis, left knee 07/19/2020  . Unilateral primary osteoarthritis, right knee 07/19/2020  . Diabetes mellitus without complication (HCC) 03/29/2020  . Pseudophakia 03/29/2020  . Posterior vitreous detachment of both eyes 03/29/2020  . 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 Headache syndrome 06/25/2018   Joellyn Rued MS, PT 11/29/20 12:09 PM  [facility] Outpatient Rehabilitation [facility] [address] [place], [place], [address] Phone: [phone]   Fax:   [phone]  Name: Diannah Rindfleisch MRN: [id number] Date of Birth: 04/02/1946   Referring diagnosis? R TKR; R knee OA Treatment diagnosis? (if different than referring diagnosis) Difficulty walking What was this (referring dx) caused by? [x]  Surgery []  Fall []  Ongoing issue [x]  Arthritis []  Other: ____________  Laterality: [x]  Rt []  Lt []  Both  Check all possible CPT codes:      [x]  97110 (Therapeutic Exercise)  []  92507 (SLP Treatment)  [x]  (Neuro Re-ed)   []  92526 (Swallowing Treatment)   [x]  97116 (Gait Training)   []  (Cognitive Training, 1st 15 minutes) [x]  97140 (Manual Therapy)   []  97130 (Cognitive Training, each add'l 15 minutes)  [x]  97530 (Therapeutic Activities)  []  Other, List CPT Code ____________    [x]  97535 (Self Care)       []  All codes above (97110 - 97535)  [x]  97012 (Mechanical Traction)  [x]  97014 (E-stim Unattended)  [x]  97032 (E-stim manual)  [x]  97033 (Ionto)  [x]  97035 (Ultrasound)  []  97760 (Orthotic Fit) []  97750 (Physical Performance Training) []  (Aquatic Therapy) []  97034 (Contrast Bath) []  (Paraffin) []  97597 (Wound Care 1st 20 sq cm) []  97598 (Wound Care each add'l 20 sq cm) []  97016 (Vasopneumatic Device) []   ) []  (Prosthetic Training)

## 2020-12-01 ENCOUNTER — Other Ambulatory Visit: Payer: Self-pay

## 2020-12-01 ENCOUNTER — Ambulatory Visit: Payer: Medicare HMO

## 2020-12-01 DIAGNOSIS — G8929 Other chronic pain: Secondary | ICD-10-CM

## 2020-12-01 DIAGNOSIS — M25662 Stiffness of left knee, not elsewhere classified: Secondary | ICD-10-CM

## 2020-12-01 DIAGNOSIS — R2689 Other abnormalities of gait and mobility: Secondary | ICD-10-CM

## 2020-12-01 DIAGNOSIS — M25561 Pain in right knee: Secondary | ICD-10-CM | POA: Diagnosis not present

## 2020-12-01 DIAGNOSIS — M6281 Muscle weakness (generalized): Secondary | ICD-10-CM

## 2020-12-01 DIAGNOSIS — R262 Difficulty in walking, not elsewhere classified: Secondary | ICD-10-CM

## 2020-12-01 NOTE — Therapy (Signed)
[facility] Outpatient Rehabilitation [facility] [address] [place], [place], [address] Phone: [phone]   Fax:  [phone]  Physical Therapy Treatment  Patient Details  Name: Joy Fitzgerald MRN: [id number] Date of Birth: 09-08-1945 Referring Provider (PT): Kathryne Hitch, MD   Encounter Date: 12/01/2020   PT End of Session - 12/01/20 1040    Visit Number 2    Number of Visits 17    Date for PT Re-Evaluation 02/03/21    Authorization Type [employer] HMO    Progress Note Due on Visit 10    PT Start Time 1042    PT Stop Time 1125    PT Time Calculation (min) 43 min    Activity Tolerance Patient tolerated treatment well    Behavior During Therapy [facility] for tasks assessed/performed           Past Medical History:  Diagnosis Date  . CKD (chronic kidney disease)   . Diabetes mellitus without complication (HCC)   . Dyspnea   . Headache syndrome 06/25/2018  . Hypertension   . Neck pain   . 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 APPENDECTOMY    . EYE SURGERY    . TOTAL KNEE ARTHROPLASTY Right 10/24/2020   Procedure: RIGHT TOTAL KNEE ARTHROPLASTY;  Surgeon: Kathryne Hitch, MD;  Location: [facility] OR;  Service: Orthopedics;  Laterality: Right;    There were no vitals filed for this visit.   Subjective Assessment - 12/01/20 1049    Subjective I've been doing my exercises and my knee is sore.    Patient Stated Goals To have good use of knee and to get out in the community/shop.    Currently in Pain? Yes    Pain Score 4     Pain Location Knee    Pain Orientation Right    Pain Descriptors / Indicators Aching    Pain Type Chronic pain    Pain Frequency Intermittent              [facility] PT Assessment - 12/01/20 0001      AROM   Right Knee Extension -9                         [facility] Adult PT Treatment/Exercise - 12/01/20 0001      Ambulation/Gait   Gait  Comments RW was raised for better standing posture. VC for heel/toe walking pattern      Exercises   Exercises Knee/Hip      Knee/Hip Exercises: Aerobic   Nustep 5 mins; L5; LEs only      Knee/Hip Exercises: Seated   Long Arc Quad Right;2 sets;10 reps    Long Arc Quad Weight 2 lbs.    Other Seated Knee/Hip Exercises 2 chair quad sets; 10x; 5 sec      Knee/Hip Exercises: Supine   Bridges Both;10 reps    Bridges Limitations decreasd clearance    Straight Leg Raises Right;Left    Straight Leg Raises Limitations quad set prior; 12 reps    Other Supine Knee/Hip Exercises Clams 15x; green Tband    Other Supine Knee/Hip Exercises Hip add sets c ball squeeze; 15x                  PT Education - 12/01/20 1506    Education Details Updated HEP    Person(s) Educated Patient    Methods Explanation;Tactile cues;Demonstration;Verbal cues;Handout  Comprehension Verbalized understanding;Returned demonstration;Verbal cues required;Tactile cues required            PT Short Term Goals - 11/29/20 1020      PT SHORT TERM GOAL #1   Title Pt will be Ind c an HEP with emphasis to improve r knee ext ROM    Baseline started on eval    Status New    Target Date 12/20/20      PT SHORT TERM GOAL #2   Title Pt will voice measures to assist with symptom management    Status New    Target Date 12/20/20      PT SHORT TERM GOAL #3   Title Asses Berg prior to transition to LRAD    Status New    Target Date 12/20/20             PT Long Term Goals - 11/29/20 1021      PT LONG TERM GOAL #1   Title Pt will demonstrate increased R knee ext ROM to -3d or less for improve functional use of the R knee and gait quality    Baseline -17d    Status New    Target Date 02/03/21      PT LONG TERM GOAL #2   Title Increase bilat hip strength to 4/5 and knee strength to 5/5 for improved balance, function, and safety    Baseline see flowsheets    Status New    Target Date 02/03/21      PT  LONG TERM GOAL #3   Title Improve TUG time c or s an AD to 12 sec as demonstration of improved functional mobility    Baseline 17.4 sce c RW    Status New    Target Date 02/03/21      PT LONG TERM GOAL #4   Title Improve 5xSTS c armrests to 20 sec or less as demonstration of improved functional mobility    Baseline 26.8 sec    Status New    Target Date 02/03/21      PT LONG TERM GOAL #5   Title Improve pt's quality of gait to 1072ft c a minimal to no limp over the R LE c a LRAD for her to complete community ambulation Indly    Status New    Target Date 02/03/21      Additional Long Term Goals   Additional Long Term Goals Yes      PT LONG TERM GOAL #6   Title Pt will be Ind in a final HEP to maintain or progress achieved level of function    Status New    Target Date 02/03/21                 Plan - 12/01/20 1041    Clinical Impression Statement PT was completed today for gait training and ther ex to address bilat LE weakness and R knee ext deficits. With raising the height of the RW and verbal cueing for a heel toe pattern, pt's quality of gait improved. Pt demonstrates generalized weakness of both LEs, having difficulty completing a bridge. Pt's R knee ext AROM c a quad set has improved to -9d from -17d on eval. Pt reports she has been consistently completing her HEP. Pt will continue to benefit from PT to address R knee ext AROM, strength, and functional deficits.    Personal Factors and Comorbidities Age;Fitness;Past/Current Experience;Social Background;Comorbidity 1    Comorbidities DM    Examination-Activity Limitations Locomotion Level;Stairs;Transfers  Examination-Participation Restrictions Community Activity    Stability/Clinical Decision Making Stable/Uncomplicated    Clinical Decision Making Low    Rehab Potential Excellent    PT Frequency 2x / week    PT Duration 8 weeks    PT Treatment/Interventions ADLs/Self Care Home Management;Cryotherapy;Electrical  Stimulation;Iontophoresis 4mg /ml Dexamethasone;Moist Heat;Ultrasound;Balance training;Therapeutic exercise;Therapeutic activities;Functional mobility training;Stair training;Gait training;Patient/family education;Manual techniques;Passive range of motion;Dry needling;Taping;Vasopneumatic Device;Joint Manipulations    PT Next Visit Plan Assess response to HEP. Check R knee ext ROM.    PT Home Exercise Plan [id number]    Consulted and Agree with Plan of Care Patient           Patient will benefit from skilled therapeutic intervention in order to improve the following deficits and impairments:  Abnormal gait,Decreased range of motion,Difficulty walking,Decreased endurance,Decreased safety awareness,Decreased activity tolerance,Pain,Decreased balance,Decreased mobility,Decreased strength,Increased edema  Visit Diagnosis: Chronic pain of right knee  Difficulty in walking, not elsewhere classified  Other abnormalities of gait and mobility  Muscle weakness (generalized)  Stiffness of left knee, not elsewhere classified     Problem List Patient Active Problem List   Diagnosis Date Noted  . 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 Unilateral primary osteoarthritis, left knee 07/19/2020  . Unilateral primary osteoarthritis, right knee 07/19/2020  . Diabetes mellitus without complication (HCC) 03/29/2020  . Pseudophakia 03/29/2020  . Posterior vitreous detachment of both eyes 03/29/2020  . 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 Headache syndrome 06/25/2018    06/27/2018 MS, PT 12/01/20 3:15 PM  [facility] Outpatient [facility] [address] [place], [place], [place] Phone: [phone]   Fax:  [phone]  Name: Joy Fitzgerald MRN: Orlie Dakin Date of Birth: 02-Jan-1946

## 2020-12-05 ENCOUNTER — Encounter: Payer: Self-pay | Admitting: Orthopaedic Surgery

## 2020-12-05 ENCOUNTER — Ambulatory Visit (INDEPENDENT_AMBULATORY_CARE_PROVIDER_SITE_OTHER): Payer: Medicare HMO | Admitting: Orthopaedic Surgery

## 2020-12-05 DIAGNOSIS — Z96651 Presence of right artificial knee joint: Secondary | ICD-10-CM

## 2020-12-05 NOTE — Progress Notes (Signed)
The patient is now 6-week status post a right total knee arthroplasty.  She is doing great overall.  She reports improvement in range of motion.  She also is now denying left knee pain.  She does ambulate with a rolling walker but is making improvements with her balance and coordination.  Her right knee incision looks good.  She lacks full extension by just a few degrees and can flex her already well past 90 degrees.  There is some swelling to be expected.  At this point we will need to see her back for 6 months unless she is having issues.  She does have therapy I think the gym which I think is reasonable.  At her visit in 6 months we will get an AP and lateral of her right knee.  If there are any issues before then she will let us know.

## 2020-12-08 ENCOUNTER — Other Ambulatory Visit: Payer: Self-pay

## 2020-12-08 ENCOUNTER — Ambulatory Visit: Payer: Medicare HMO | Attending: Orthopaedic Surgery | Admitting: Physical Therapy

## 2020-12-08 DIAGNOSIS — M25561 Pain in right knee: Secondary | ICD-10-CM | POA: Diagnosis not present

## 2020-12-08 DIAGNOSIS — G8929 Other chronic pain: Secondary | ICD-10-CM | POA: Diagnosis present

## 2020-12-08 DIAGNOSIS — R2689 Other abnormalities of gait and mobility: Secondary | ICD-10-CM | POA: Insufficient documentation

## 2020-12-08 DIAGNOSIS — M6281 Muscle weakness (generalized): Secondary | ICD-10-CM | POA: Diagnosis present

## 2020-12-08 DIAGNOSIS — M25662 Stiffness of left knee, not elsewhere classified: Secondary | ICD-10-CM | POA: Insufficient documentation

## 2020-12-08 DIAGNOSIS — R262 Difficulty in walking, not elsewhere classified: Secondary | ICD-10-CM | POA: Diagnosis present

## 2020-12-08 NOTE — Therapy (Signed)
[facility] Outpatient Rehabilitation [facility] [address] [place], [place], [address] Phone: [phone]   Fax:  [phone]  Physical Therapy Treatment  Patient Details  Name: Joy Fitzgerald MRN: [id number] Date of Birth: [birth year] Referring Provider (PT): Kathryne Hitch, MD   Encounter Date: 12/08/2020   PT End of Session - 12/08/20 1133    Number of Visits 17    Date for PT Re-Evaluation 02/03/21    Authorization Type [employer]IC[employer] HMO    Progress Note Due on Visit 10    PT Start Time 1133    Activity Tolerance Patient tolerated treatment well    Behavior During Therapy [facility] for tasks assessed/performed           Past Medical History:  Diagnosis Date  . CKD (chronic kidney disease)   . Diabetes mellitus without complication (HCC)   . Dyspnea   . Headache syndrome 06/25/2018  . Hypertension   . Neck pain   . 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 APPENDECTOMY    . EYE SURGERY    . TOTAL KNEE ARTHROPLASTY Right 10/24/2020   Procedure: RIGHT TOTAL KNEE ARTHROPLASTY;  Surgeon: Kathryne Hitch, MD;  Location: [facility] OR;  Service: Orthopedics;  Laterality: Right;    There were no vitals filed for this visit.   Subjective Assessment - 12/08/20 1134    Subjective Pt reports that she is doing well.  She reports she is HEP compliant.  She reports the bridge at home is difficult.  She reports that her knee is a little sore today, possibly because of the weather.    Pertinent History R TKA 3/22    Patient Stated Goals To have good use of knee and to get out in the community/shop.    Currently in Pain? Yes    Pain Score 3     Pain Location Knee    Pain Orientation Right    Pain Descriptors / Indicators Aching    Pain Type Surgical pain;Chronic pain                             [facility] Adult PT Treatment/Exercise - 12/08/20 0001      Knee/Hip Exercises: Aerobic    Nustep 5 mins; L5; LEs only      Knee/Hip Exercises: Standing   Heel Raises Limitations heel toe rocking 2x20 in //    Forward Step Up Limitations in // - 2x10 4'' step      Knee/Hip Exercises: Seated   Long Arc Quad Right;2 sets;10 reps    Long Arc Quad Weight 3 lbs.    Other Seated Knee/Hip Exercises 2 chair quad sets; 10x; 5 sec                    PT Short Term Goals - 11/29/20 1020      PT SHORT TERM GOAL #1   Title Pt will be Ind c an HEP with emphasis to improve r knee ext ROM    Baseline started on eval    Status New    Target Date 12/20/20      PT SHORT TERM GOAL #2   Title Pt will voice measures to assist with symptom management    Status New    Target Date 12/20/20      PT SHORT TERM GOAL #3   Title Asses Berg prior to transition to  LRAD    Status New    Target Date 12/20/20             PT Long Term Goals - 11/29/20 1021      PT LONG TERM GOAL #1   Title Pt will demonstrate increased R knee ext ROM to -3d or less for improve functional use of the R knee and gait quality    Baseline -17d    Status New    Target Date 02/03/21      PT LONG TERM GOAL #2   Title Increase bilat hip strength to 4/5 and knee strength to 5/5 for improved balance, function, and safety    Baseline see flowsheets    Status New    Target Date 02/03/21      PT LONG TERM GOAL #3   Title Improve TUG time c or s an AD to 12 sec as demonstration of improved functional mobility    Baseline 17.4 sce c RW    Status New    Target Date 02/03/21      PT LONG TERM GOAL #4   Title Improve 5xSTS c armrests to 20 sec or less as demonstration of improved functional mobility    Baseline 26.8 sec    Status New    Target Date 02/03/21      PT LONG TERM GOAL #5   Title Improve pt's quality of gait to 1053ft c a minimal to no limp over the R LE c a LRAD for her to complete community ambulation Indly    Status New    Target Date 02/03/21      Additional Long Term Goals    Additional Long Term Goals Yes      PT LONG TERM GOAL #6   Title Pt will be Ind in a final HEP to maintain or progress achieved level of function    Status New    Target Date 02/03/21                 Plan - 12/08/20 1159    Clinical Impression Statement Pt tolerates session well. She has high level of fatigue with standing exercises and requires brief seated rest breaks.  Has signficant quad weakness R LE and limitation in R knee ext which improves with quad setting.  Has high level of pain with activity, but she reports this is typical during exercise.    Personal Factors and Comorbidities Age;Fitness;Past/Current Experience;Social Background;Comorbidity 1    Comorbidities DM    Examination-Activity Limitations Locomotion Level;Stairs;Transfers    Examination-Participation Restrictions Community Activity    Stability/Clinical Decision Making Stable/Uncomplicated    Rehab Potential Excellent    PT Frequency 2x / week    PT Duration 8 weeks    PT Treatment/Interventions ADLs/Self Care Home Management;Cryotherapy;Electrical Stimulation;Iontophoresis 4mg /ml Dexamethasone;Moist Heat;Ultrasound;Balance training;Therapeutic exercise;Therapeutic activities;Functional mobility training;Stair training;Gait training;Patient/family education;Manual techniques;Passive range of motion;Dry needling;Taping;Vasopneumatic Device;Joint Manipulations    PT Next Visit Plan Assess response to HEP. Check R knee ext ROM.    PT Home Exercise Plan [id number]    Consulted and Agree with Plan of Care Patient           Patient will benefit from skilled therapeutic intervention in order to improve the following deficits and impairments:  Abnormal gait,Decreased range of motion,Difficulty walking,Decreased endurance,Decreased safety awareness,Decreased activity tolerance,Pain,Decreased balance,Decreased mobility,Decreased strength,Increased edema  Visit Diagnosis: Chronic pain of right knee  Difficulty in  walking, not elsewhere classified  Other abnormalities of gait and mobility  Problem List Patient Active Problem List   Diagnosis Date Noted  . 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 Unilateral primary osteoarthritis, left knee 07/19/2020  . Unilateral primary osteoarthritis, right knee 07/19/2020  . Diabetes mellitus without complication (HCC) 03/29/2020  . Pseudophakia 03/29/2020  . Posterior vitreous detachment of both eyes 03/29/2020  . 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 Headache syndrome 06/25/2018    Alphonzo Severance PT, DPT 12/08/20 12:26 PM  [facility] [address] [place], [place], [address] Phone: [phone]   Fax:  [phone]  Name: Joy Fitzgerald MRN: [id number] Date of Birth: Nov 08, 1945

## 2020-12-12 ENCOUNTER — Ambulatory Visit: Payer: Medicare HMO | Admitting: Physical Therapy

## 2020-12-12 ENCOUNTER — Other Ambulatory Visit: Payer: Self-pay

## 2020-12-12 ENCOUNTER — Encounter: Payer: Self-pay | Admitting: Physical Therapy

## 2020-12-12 DIAGNOSIS — R262 Difficulty in walking, not elsewhere classified: Secondary | ICD-10-CM

## 2020-12-12 DIAGNOSIS — M6281 Muscle weakness (generalized): Secondary | ICD-10-CM

## 2020-12-12 DIAGNOSIS — G8929 Other chronic pain: Secondary | ICD-10-CM

## 2020-12-12 DIAGNOSIS — R2689 Other abnormalities of gait and mobility: Secondary | ICD-10-CM

## 2020-12-12 DIAGNOSIS — M25561 Pain in right knee: Secondary | ICD-10-CM | POA: Diagnosis not present

## 2020-12-12 NOTE — Therapy (Signed)
[facility] Outpatient Rehabilitation [facility] [address] [place], [place], [address] Phone: [phone]   Fax:  [phone]  Physical Therapy Treatment  Patient Details  Name: Joy Fitzgerald MRN: [id number] Date of Birth: 10/15/1945 Referring Provider (PT): Kathryne Hitch, MD   Encounter Date: 12/12/2020   PT End of Session - 12/12/20 0950    Visit Number 4    Number of Visits 17    Date for PT Re-Evaluation 02/03/21    Authorization Type [employer] HMO    Progress Note Due on Visit 10    PT Start Time 0950    PT Stop Time 1033    PT Time Calculation (min) 43 min    Activity Tolerance Patient tolerated treatment well    Behavior During Therapy [facility] for tasks assessed/performed           Past Medical History:  Diagnosis Date  . CKD (chronic kidney disease)   . Diabetes mellitus without complication (HCC)   . Dyspnea   . Headache syndrome 06/25/2018  . Hypertension   . Neck pain   . 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 APPENDECTOMY    . EYE SURGERY    . TOTAL KNEE ARTHROPLASTY Right 10/24/2020   Procedure: RIGHT TOTAL KNEE ARTHROPLASTY;  Surgeon: Kathryne Hitch, MD;  Location: [facility] OR;  Service: Orthopedics;  Laterality: Right;    There were no vitals filed for this visit.   Subjective Assessment - 12/12/20 0950    Subjective Pt reports that she is doing well this morning.  She has had some increased pain at home when walking.  Overall she feels like she is improving.  Whe wants to work on walking without her walker.    Pertinent History R TKA 3/22 with significant ext deficit    Patient Stated Goals To have good use of knee and to get out in the community/shop.    Currently in Pain? Yes    Pain Score 4     Pain Location Knee    Pain Orientation Right              [facility] PT Assessment - 12/12/20 0001      AROM   Right Knee Extension --   lacking 5  degrees of ext                        [facility] Adult PT Treatment/Exercise - 12/12/20 0001      Knee/Hip Exercises: Aerobic   Nustep 5 mins; L5; LEs only      Knee/Hip Exercises: Standing   Heel Raises Limitations heel toe rocking 2x20 in //    Forward Step Up Limitations in // - 2x12 4'' step      Knee/Hip Exercises: Seated   Long Arc Quad Right;10 reps;3 sets    [place] Weight 3 lbs.    Other Seated Knee/Hip Exercises 2 chair quad sets; 2x10x; 5 sec    Sit to Sand with UE support   2x5 with UE push off                   PT Short Term Goals - 11/29/20 1020      PT SHORT TERM GOAL #1   Title Pt will be Ind c an HEP with emphasis to improve r knee ext ROM    Baseline started on eval    Status New  Target Date 12/20/20      PT SHORT TERM GOAL #2   Title Pt will voice measures to assist with symptom management    Status New    Target Date 12/20/20      PT SHORT TERM GOAL #3   Title Asses Berg prior to transition to LRAD    Status New    Target Date 12/20/20             PT Long Term Goals - 11/29/20 1021      PT LONG TERM GOAL #1   Title Pt will demonstrate increased R knee ext ROM to -3d or less for improve functional use of the R knee and gait quality    Baseline -17d    Status New    Target Date 02/03/21      PT LONG TERM GOAL #2   Title Increase bilat hip strength to 4/5 and knee strength to 5/5 for improved balance, function, and safety    Baseline see flowsheets    Status New    Target Date 02/03/21      PT LONG TERM GOAL #3   Title Improve TUG time c or s an AD to 12 sec as demonstration of improved functional mobility    Baseline 17.4 sce c RW    Status New    Target Date 02/03/21      PT LONG TERM GOAL #4   Title Improve 5xSTS c armrests to 20 sec or less as demonstration of improved functional mobility    Baseline 26.8 sec    Status New    Target Date 02/03/21      PT LONG TERM GOAL #5   Title Improve pt's  quality of gait to 1057ft c a minimal to no limp over the R LE c a LRAD for her to complete community ambulation Indly    Status New    Target Date 02/03/21      Additional Long Term Goals   Additional Long Term Goals Yes      PT LONG TERM GOAL #6   Title Pt will be Ind in a final HEP to maintain or progress achieved level of function    Status New    Target Date 02/03/21                 Plan - 12/12/20 1005    Clinical Impression Statement Pt tolerates session well.  She is progressing as expected.  Significant improvement in ext ROM today.  Tolerates higher volume of standing exercise toay.  Will continue working on ext ROM and standing tolerance moving toward balance and ambulation with SPC as appropriate.    Personal Factors and Comorbidities Age;Fitness;Past/Current Experience;Social Background;Comorbidity 1    Comorbidities DM    Examination-Activity Limitations Locomotion Level;Stairs;Transfers    Examination-Participation Restrictions Community Activity    Stability/Clinical Decision Making Stable/Uncomplicated    Rehab Potential Excellent    PT Frequency 2x / week    PT Duration 8 weeks    PT Treatment/Interventions ADLs/Self Care Home Management;Cryotherapy;Electrical Stimulation;Iontophoresis 4mg /ml Dexamethasone;Moist Heat;Ultrasound;Balance training;Therapeutic exercise;Therapeutic activities;Functional mobility training;Stair training;Gait training;Patient/family education;Manual techniques;Passive range of motion;Dry needling;Taping;Vasopneumatic Device;Joint Manipulations    PT Next Visit Plan Will continue working on ext ROM and standing tolerance moving toward balance and ambulation with SPC as appropriate.    PT Home Exercise Plan [id number]    Consulted and Agree with Plan of Care Patient           Patient will benefit  from skilled therapeutic intervention in order to improve the following deficits and impairments:  Abnormal gait,Decreased range of  motion,Difficulty walking,Decreased endurance,Decreased safety awareness,Decreased activity tolerance,Pain,Decreased balance,Decreased mobility,Decreased strength,Increased edema  Visit Diagnosis: Chronic pain of right knee  Difficulty in walking, not elsewhere classified  Other abnormalities of gait and mobility  Muscle weakness (generalized)     Problem List Patient Active Problem List   Diagnosis Date Noted  . 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 Unilateral primary osteoarthritis, left knee 07/19/2020  . Unilateral primary osteoarthritis, right knee 07/19/2020  . Diabetes mellitus without complication (HCC) 03/29/2020  . Pseudophakia 03/29/2020  . Posterior vitreous detachment of both eyes 03/29/2020  . 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 Headache syndrome 06/25/2018    Alphonzo Severance PT, DPT 12/12/20 10:37 AM  [facility] [address] [place], [place], [address] Phone: [phone]   Fax:  [phone]  Name: Joy Fitzgerald MRN: [id number] Date of Birth: Jul 27, 1946

## 2020-12-15 ENCOUNTER — Ambulatory Visit: Payer: Medicare HMO | Admitting: Physical Therapy

## 2020-12-19 ENCOUNTER — Encounter: Payer: Self-pay | Admitting: Physical Therapy

## 2020-12-19 ENCOUNTER — Ambulatory Visit: Payer: Medicare HMO | Admitting: Physical Therapy

## 2020-12-19 ENCOUNTER — Other Ambulatory Visit: Payer: Self-pay

## 2020-12-19 DIAGNOSIS — R2689 Other abnormalities of gait and mobility: Secondary | ICD-10-CM

## 2020-12-19 DIAGNOSIS — M25561 Pain in right knee: Secondary | ICD-10-CM | POA: Diagnosis not present

## 2020-12-19 DIAGNOSIS — M6281 Muscle weakness (generalized): Secondary | ICD-10-CM

## 2020-12-19 DIAGNOSIS — R262 Difficulty in walking, not elsewhere classified: Secondary | ICD-10-CM

## 2020-12-19 DIAGNOSIS — G8929 Other chronic pain: Secondary | ICD-10-CM

## 2020-12-19 NOTE — Therapy (Addendum)
[facility] Outpatient Rehabilitation [facility] [address] [place], [place], [address] Phone: [phone]   Fax:  [phone]  Physical Therapy Treatment  Patient Details  Name: Joy Fitzgerald MRN: [id number] Date of Birth: [birth year] Referring Provider (PT): Kathryne Hitch, MD   Encounter Date: 12/19/2020   PT End of Session - 12/19/20 0947    Visit Number 5    Number of Visits 17    Date for PT Re-Evaluation 02/03/21    Authorization Type [employer] HMO    Progress Note Due on Visit 10    PT Start Time 0945    PT Stop Time 1028    PT Time Calculation (min) 43 min    Activity Tolerance Patient tolerated treatment well    Behavior During Therapy [facility] for tasks assessed/performed           Past Medical History:  Diagnosis Date  . CKD (chronic kidney disease)   . Diabetes mellitus without complication (HCC)   . Dyspnea   . Headache syndrome 06/25/2018  . Hypertension   . Neck pain   . 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 APPENDECTOMY    . EYE SURGERY    . TOTAL KNEE ARTHROPLASTY Right 10/24/2020   Procedure: RIGHT TOTAL KNEE ARTHROPLASTY;  Surgeon: Kathryne Hitch, MD;  Location: [facility] OR;  Service: Orthopedics;  Laterality: Right;    There were no vitals filed for this visit.   Subjective Assessment - 12/19/20 0948    Subjective Pt reports that her knee has been improving.  She has been trying to cook more at home which has been going well.  She feels she has a bit of a catch in her knee this morning, she's not sure why.    Pertinent History R TKA 3/22 with significant ext deficit    Patient Stated Goals To have good use of knee and to get out in the community/shop.    Currently in Pain? Yes    Pain Score 3     Pain Location Knee    Pain Orientation Right                             [facility] Adult PT Treatment/Exercise - 12/19/20 0001       Knee/Hip Exercises: Aerobic   Nustep 5 mins; L5; LEs only      Knee/Hip Exercises: Standing   Heel Raises Limitations heel toe rocking 2x20 in //    Other Standing Knee Exercises walking - 1 lap with SPC - CGA (2 seated rest breaks)      Knee/Hip Exercises: Seated   Long Arc Quad Right;10 reps;3 sets    [place] Weight 4 lbs.    Other Seated Knee/Hip Exercises 2 chair quad sets; 2x10x; 5 sec    Sit to Sand with UE support   3x5 with UE push off                   PT Short Term Goals - 11/29/20 1020      PT SHORT TERM GOAL #1   Title Pt will be Ind c an HEP with emphasis to improve r knee ext ROM    Baseline started on eval    Status New    Target Date 12/20/20      PT SHORT TERM GOAL #2   Title Pt will voice measures to  assist with symptom management    Status New    Target Date 12/20/20      PT SHORT TERM GOAL #3   Title Asses Berg prior to transition to LRAD    Status New    Target Date 12/20/20             PT Long Term Goals - 11/29/20 1021      PT LONG TERM GOAL #1   Title Pt will demonstrate increased R knee ext ROM to -3d or less for improve functional use of the R knee and gait quality    Baseline -17d    Status New    Target Date 02/03/21      PT LONG TERM GOAL #2   Title Increase bilat hip strength to 4/5 and knee strength to 5/5 for improved balance, function, and safety    Baseline see flowsheets    Status New    Target Date 02/03/21      PT LONG TERM GOAL #3   Title Improve TUG time c or s an AD to 12 sec as demonstration of improved functional mobility    Baseline 17.4 sce c RW    Status New    Target Date 02/03/21      PT LONG TERM GOAL #4   Title Improve 5xSTS c armrests to 20 sec or less as demonstration of improved functional mobility    Baseline 26.8 sec    Status New    Target Date 02/03/21      PT LONG TERM GOAL #5   Title Improve pt's quality of gait to 1059ft c a minimal to no limp over the R LE c a LRAD for her to  complete community ambulation Indly    Status New    Target Date 02/03/21      Additional Long Term Goals   Additional Long Term Goals Yes      PT LONG TERM GOAL #6   Title Pt will be Ind in a final HEP to maintain or progress achieved level of function    Status New    Target Date 02/03/21                 Plan - 12/19/20 1001    Clinical Impression Statement Pt is progressing well with therapy.  She shows significant lack of R knee ext with gait when entering clinic, but this improves progressively throughout exercise.  We discussed the importance of frequent exercise at home; she confirms understanding.  We concentrated on progressive strengthening today which was tolerated well.  Pt tolerated walking with SPC well with some increase in discomfort which was improved with brief seated rests.    Personal Factors and Comorbidities Age;Fitness;Past/Current Experience;Social Background;Comorbidity 1    Comorbidities DM    Examination-Activity Limitations Locomotion Level;Stairs;Transfers    Examination-Participation Restrictions Community Activity    Stability/Clinical Decision Making Stable/Uncomplicated    Rehab Potential Excellent    PT Frequency 2x / week    PT Duration 8 weeks    PT Treatment/Interventions ADLs/Self Care Home Management;Cryotherapy;Electrical Stimulation;Iontophoresis 4mg /ml Dexamethasone;Moist Heat;Ultrasound;Balance training;Therapeutic exercise;Therapeutic activities;Functional mobility training;Stair training;Gait training;Patient/family education;Manual techniques;Passive range of motion;Dry needling;Taping;Vasopneumatic Device;Joint Manipulations    PT Next Visit Plan Will continue working on ext ROM and standing tolerance moving toward balance and ambulation with SPC as appropriate.    PT Home Exercise Plan [id number]    Consulted and Agree with Plan of Care Patient  Patient will benefit from skilled therapeutic intervention in order to improve  the following deficits and impairments:  Abnormal gait,Decreased range of motion,Difficulty walking,Decreased endurance,Decreased safety awareness,Decreased activity tolerance,Pain,Decreased balance,Decreased mobility,Decreased strength,Increased edema  Visit Diagnosis: Chronic pain of right knee  Difficulty in walking, not elsewhere classified  Other abnormalities of gait and mobility  Muscle weakness (generalized)     Problem List Patient Active Problem List   Diagnosis Date Noted  . 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 Unilateral primary osteoarthritis, left knee 07/19/2020  . Unilateral primary osteoarthritis, right knee 07/19/2020  . Diabetes mellitus without complication (HCC) 03/29/2020  . Pseudophakia 03/29/2020  . Posterior vitreous detachment of both eyes 03/29/2020  . 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 Headache syndrome 06/25/2018    Fredderick Phenix 12/19/2020, 10:40 AM  [facility] [address] [place], [place], [address] Phone: [phone]   Fax:  [phone]  Name: Joy Fitzgerald MRN: [id number] Date of Birth: 1946-07-03

## 2020-12-21 ENCOUNTER — Other Ambulatory Visit: Payer: Self-pay

## 2020-12-21 ENCOUNTER — Ambulatory Visit: Payer: Medicare HMO

## 2020-12-21 DIAGNOSIS — M25561 Pain in right knee: Secondary | ICD-10-CM | POA: Diagnosis not present

## 2020-12-21 DIAGNOSIS — M25662 Stiffness of left knee, not elsewhere classified: Secondary | ICD-10-CM

## 2020-12-21 DIAGNOSIS — R2689 Other abnormalities of gait and mobility: Secondary | ICD-10-CM

## 2020-12-21 DIAGNOSIS — M6281 Muscle weakness (generalized): Secondary | ICD-10-CM

## 2020-12-21 DIAGNOSIS — G8929 Other chronic pain: Secondary | ICD-10-CM

## 2020-12-21 DIAGNOSIS — R262 Difficulty in walking, not elsewhere classified: Secondary | ICD-10-CM

## 2020-12-21 NOTE — Therapy (Signed)
[facility] Outpatient Rehabilitation [facility] [address] [place], [place], [address] Phone: [phone]   Fax:  [phone]  Physical Therapy Treatment  Patient Details  Name: Joy Fitzgerald MRN: [id number] Date of Birth: 1946/05/13 Referring Provider (PT): Kathryne Hitch, MD   Encounter Date: 12/21/2020   PT End of Session - 12/21/20 1007    Visit Number 6    Number of Visits 17    Date for PT Re-Evaluation 02/03/21    Authorization Type [employer]IC[employer] HMO    Progress Note Due on Visit 10    PT Start Time 1007    PT Stop Time 1050    PT Time Calculation (min) 43 min    Activity Tolerance Patient tolerated treatment well    Behavior During Therapy [facility] for tasks assessed/performed           Past Medical History:  Diagnosis Date  . CKD (chronic kidney disease)   . Diabetes mellitus without complication (HCC)   . Dyspnea   . Headache syndrome 06/25/2018  . Hypertension   . Neck pain   . 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 APPENDECTOMY    . EYE SURGERY    . TOTAL KNEE ARTHROPLASTY Right 10/24/2020   Procedure: RIGHT TOTAL KNEE ARTHROPLASTY;  Surgeon: Kathryne Hitch, MD;  Location: [facility] OR;  Service: Orthopedics;  Laterality: Right;    There were no vitals filed for this visit.   Subjective Assessment - 12/21/20 1014    Subjective Pt reports trying to walk in her home some without the RW, but it bothered her R knee.    Pertinent History R TKA 3/22 with significant ext deficit    Patient Stated Goals To have good use of knee and to get out in the community/shop.    Pain Score 2     Pain Location Knee    Pain Orientation Right    Pain Descriptors / Indicators Aching    Pain Type Chronic pain;Surgical pain    Pain Frequency Intermittent              [facility] PT Assessment - 12/21/20 0001      AROM   Right Knee Extension -5   with quad sets                         [facility] Adult PT Treatment/Exercise - 12/21/20 0001      Ambulation/Gait   Gait Comments Gait training c the SPC. Cueing for sequcing c SPC and stepping with R LE. Gait pttern is antalgic c reports of min pain. pt walks c  decreased knee ext, forward flexed trunk and hip flexion. SBA for safety.ed      Knee/Hip Exercises: Stretches   Other Knee/Hip Stretches 2 chair hamstring stretch c quad sets; reaching R hand toward toes and L hand stabilizing the R knee      Knee/Hip Exercises: Aerobic   Nustep 6 mins; L5; LEs only      Knee/Hip Exercises: Seated   Long Arc Quad Right;2 sets;15 reps    Long Arc Quad Weight 4 lbs.    Other Seated Knee/Hip Exercises 2 chair quad sets; 2x10x; 5 sec      Knee/Hip Exercises: Supine   Bridges Both;2 sets;10 reps    Bridges Limitations partial clearance    Straight Leg Raises Right;2 sets;10 reps    Straight Leg Raises Limitations 2 lbs  Other Supine Knee/Hip Exercises R hip clam 15x; red Tband      Knee/Hip Exercises: Sidelying   Hip ABduction AAROM;Left;10 reps   weak                   PT Short Term Goals - 12/21/20 1153      PT SHORT TERM GOAL #1   Title Pt will be Ind c an HEP with emphasis to improve r knee ext ROM    Baseline started on eval    Status Achieved    Target Date 12/21/20      PT SHORT TERM GOAL #2   Title Pt will voice measures to assist with symptom management    Status Achieved    Target Date 12/21/20      PT SHORT TERM GOAL #3   Status On-going    Target Date 12/30/20             PT Long Term Goals - 11/29/20 1021      PT LONG TERM GOAL #1   Title Pt will demonstrate increased R knee ext ROM to -3d or less for improve functional use of the R knee and gait quality    Baseline -17d    Status New    Target Date 02/03/21      PT LONG TERM GOAL #2   Title Increase bilat hip strength to 4/5 and knee strength to 5/5 for improved balance, function, and safety    Baseline  see flowsheets    Status New    Target Date 02/03/21      PT LONG TERM GOAL #3   Title Improve TUG time c or s an AD to 12 sec as demonstration of improved functional mobility    Baseline 17.4 sce c RW    Status New    Target Date 02/03/21      PT LONG TERM GOAL #4   Title Improve 5xSTS c armrests to 20 sec or less as demonstration of improved functional mobility    Baseline 26.8 sec    Status New    Target Date 02/03/21      PT LONG TERM GOAL #5   Title Improve pt's quality of gait to 1018ft c a minimal to no limp over the R LE c a LRAD for her to complete community ambulation Indly    Status New    Target Date 02/03/21      Additional Long Term Goals   Additional Long Term Goals Yes      PT LONG TERM GOAL #6   Title Pt will be Ind in a final HEP to maintain or progress achieved level of function    Status New    Target Date 02/03/21                 Plan - 12/21/20 1007    Clinical Impression Statement PT was completed for gait training c a SPC, knee ext ROM, and hip strengthening. Pt tolerated walking c the [facility] better today being able to walk 185 ft c a rest break. No LOB was noted, but SBA was provided for safety due to LE weakness. Pt walks c trunk, hips and knees somewhat flexed. B hips are weak with pt only able to complete a bridge with min hip clearance. Pt needs A to complete R hip abd against gravity. Overall knee ext AROM is improving as well as pt's functional mobility. Recommended obtaining a SPC for the future, but not  to walk with it until approved by PT. Pt voiced understanding.    Personal Factors and Comorbidities Age;Fitness;Past/Current Experience;Social Background;Comorbidity 1    Comorbidities DM    Examination-Activity Limitations Locomotion Level;Stairs;Transfers    Examination-Participation Restrictions Community Activity    Stability/Clinical Decision Making Stable/Uncomplicated    Clinical Decision Making Low    Rehab Potential Excellent    PT  Frequency 2x / week    PT Duration 8 weeks    PT Treatment/Interventions ADLs/Self Care Home Management;Cryotherapy;Electrical Stimulation;Iontophoresis 4mg /ml Dexamethasone;Moist Heat;Ultrasound;Balance training;Therapeutic exercise;Therapeutic activities;Functional mobility training;Stair training;Gait training;Patient/family education;Manual techniques;Passive range of motion;Dry needling;Taping;Vasopneumatic Device;Joint Manipulations    PT Next Visit Plan Assess Berg. Will continue working on ext ROM, LE strenghtening, and standing tolerance moving toward balance and ambulation with SPC as appropriate.    PT Home Exercise Plan [id number]    Consulted and Agree with Plan of Care Patient           Patient will benefit from skilled therapeutic intervention in order to improve the following deficits and impairments:  Abnormal gait,Decreased range of motion,Difficulty walking,Decreased endurance,Decreased safety awareness,Decreased activity tolerance,Pain,Decreased balance,Decreased mobility,Decreased strength,Increased edema  Visit Diagnosis: Chronic pain of right knee  Difficulty in walking, not elsewhere classified  Other abnormalities of gait and mobility  Muscle weakness (generalized)  Stiffness of left knee, not elsewhere classified     Problem List Patient Active Problem List   Diagnosis Date Noted  . 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 Unilateral primary osteoarthritis, left knee 07/19/2020  . Unilateral primary osteoarthritis, right knee 07/19/2020  . Diabetes mellitus without complication (HCC) 03/29/2020  . Pseudophakia 03/29/2020  . Posterior vitreous detachment of both eyes 03/29/2020  . 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 Headache syndrome 06/25/2018   06/27/2018 MS, PT 12/21/20 11:57 AM  [facility] [address] [place], [place], [place] Phone: [phone]   Fax:   [phone]  Name: Joy Fitzgerald MRN: Orlie Dakin Date of Birth: [birth year]

## 2020-12-26 ENCOUNTER — Other Ambulatory Visit: Payer: Self-pay

## 2020-12-26 ENCOUNTER — Encounter: Payer: Self-pay | Admitting: Physical Therapy

## 2020-12-26 ENCOUNTER — Ambulatory Visit: Payer: Medicare HMO | Admitting: Physical Therapy

## 2020-12-26 DIAGNOSIS — M6281 Muscle weakness (generalized): Secondary | ICD-10-CM

## 2020-12-26 DIAGNOSIS — R262 Difficulty in walking, not elsewhere classified: Secondary | ICD-10-CM

## 2020-12-26 DIAGNOSIS — M25662 Stiffness of left knee, not elsewhere classified: Secondary | ICD-10-CM

## 2020-12-26 DIAGNOSIS — R2689 Other abnormalities of gait and mobility: Secondary | ICD-10-CM

## 2020-12-26 DIAGNOSIS — M25561 Pain in right knee: Secondary | ICD-10-CM | POA: Diagnosis not present

## 2020-12-26 DIAGNOSIS — G8929 Other chronic pain: Secondary | ICD-10-CM

## 2020-12-26 NOTE — Therapy (Signed)
[facility] Outpatient Rehabilitation [facility] [address] [place], [place], [address] Phone: [phone]   Fax:  [phone]  Physical Therapy Treatment  Patient Details  Name: Joy Fitzgerald MRN: [id number] Date of Birth: [birth year] Referring Provider (PT): Kathryne Hitch, MD   Encounter Date: 12/26/2020   PT End of Session - 12/26/20 1002    Visit Number 7    Number of Visits 17    Date for PT Re-Evaluation 02/03/21    Authorization Type [employer] HMO    Progress Note Due on Visit 10    PT Start Time 1000    PT Stop Time 1043    PT Time Calculation (min) 43 min    Activity Tolerance Patient tolerated treatment well    Behavior During Therapy [facility] for tasks assessed/performed           Past Medical History:  Diagnosis Date  . CKD (chronic kidney disease)   . Diabetes mellitus without complication (HCC)   . Dyspnea   . Headache syndrome 06/25/2018  . Hypertension   . Neck pain   . 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 APPENDECTOMY    . EYE SURGERY    . TOTAL KNEE ARTHROPLASTY Right 10/24/2020   Procedure: RIGHT TOTAL KNEE ARTHROPLASTY;  Surgeon: Kathryne Hitch, MD;  Location: [facility] OR;  Service: Orthopedics;  Laterality: Right;    There were no vitals filed for this visit.   Subjective Assessment - 12/26/20 1003    Subjective Pt reports that she feels like she is getting stronger.  She has been walking without her walker at home.  She reports not falls.    Pertinent History R TKA 3/22 with significant ext deficit    Patient Stated Goals To have good use of knee and to get out in the community/shop.              [facility] PT Assessment - 12/26/20 0001      AROM   Right Knee Extension 4   lacking                        [facility] Adult PT Treatment/Exercise - 12/26/20 0001      Ambulation/Gait   Gait Comments Gait training c the SPC. Cueing for  sequcing c SPC and stepping with R LE. Gait pttern is antalgic c reports of min pain. pt walks c  decreased knee ext, forward flexed trunk and hip flexion. SBA for safety.ed      Knee/Hip Exercises: Stretches   Other Knee/Hip Stretches 2 chair hamstring stretch c quad sets; reaching R hand toward toes and L hand stabilizing the R knee      Knee/Hip Exercises: Aerobic   Nustep 6 mins; L5; LEs only      Knee/Hip Exercises: Seated   Long Arc Quad Right;3 sets;10 reps    Long Arc Quad Weight 5 lbs.      Knee/Hip Exercises: Supine   Bridges Limitations 2x5 partial ROM    Straight Leg Raises Limitations 2 sets of 6x with 2#    Other Supine Knee/Hip Exercises x15 with red theraband                    PT Short Term Goals - 12/21/20 1153      PT SHORT TERM GOAL #1   Title Pt will be Ind c an HEP with emphasis to improve  r knee ext ROM    Baseline started on eval    Status Achieved    Target Date 12/21/20      PT SHORT TERM GOAL #2   Title Pt will voice measures to assist with symptom management    Status Achieved    Target Date 12/21/20      PT SHORT TERM GOAL #3   Status On-going    Target Date 12/30/20             PT Long Term Goals - 11/29/20 1021      PT LONG TERM GOAL #1   Title Pt will demonstrate increased R knee ext ROM to -3d or less for improve functional use of the R knee and gait quality    Baseline -17d    Status New    Target Date 02/03/21      PT LONG TERM GOAL #2   Title Increase bilat hip strength to 4/5 and knee strength to 5/5 for improved balance, function, and safety    Baseline see flowsheets    Status New    Target Date 02/03/21      PT LONG TERM GOAL #3   Title Improve TUG time c or s an AD to 12 sec as demonstration of improved functional mobility    Baseline 17.4 sce c RW    Status New    Target Date 02/03/21      PT LONG TERM GOAL #4   Title Improve 5xSTS c armrests to 20 sec or less as demonstration of improved functional  mobility    Baseline 26.8 sec    Status New    Target Date 02/03/21      PT LONG TERM GOAL #5   Title Improve pt's quality of gait to 1078ft c a minimal to no limp over the R LE c a LRAD for her to complete community ambulation Indly    Status New    Target Date 02/03/21      Additional Long Term Goals   Additional Long Term Goals Yes      PT LONG TERM GOAL #6   Title Pt will be Ind in a final HEP to maintain or progress achieved level of function    Status New    Target Date 02/03/21                 Plan - 12/26/20 1032    Clinical Impression Statement Pt is progressing well with therapy.  Ext ROM is slowly improvin to neutral.  pt required reduced cuing for sequencing for gait today.  She did require one seated rest break d/t low back pain.  slowly able to progress LE strengthening.  Will continue to work on strengthening with combination of working toward safe gait with SPC.    Personal Factors and Comorbidities Age;Fitness;Past/Current Experience;Social Background;Comorbidity 1    Comorbidities DM    Examination-Activity Limitations Locomotion Level;Stairs;Transfers    Examination-Participation Restrictions Community Activity    Stability/Clinical Decision Making Stable/Uncomplicated    Rehab Potential Excellent    PT Frequency 2x / week    PT Duration 8 weeks    PT Treatment/Interventions ADLs/Self Care Home Management;Cryotherapy;Electrical Stimulation;Iontophoresis 4mg /ml Dexamethasone;Moist Heat;Ultrasound;Balance training;Therapeutic exercise;Therapeutic activities;Functional mobility training;Stair training;Gait training;Patient/family education;Manual techniques;Passive range of motion;Dry needling;Taping;Vasopneumatic Device;Joint Manipulations    PT Next Visit Plan Will continue working on ext ROM, LE strenghtening, and standing tolerance moving toward balance and ambulation with SPC as appropriate.    PT Home Exercise Plan  [id number]    Consulted and Agree with  Plan of Care Patient           Patient will benefit from skilled therapeutic intervention in order to improve the following deficits and impairments:  Abnormal gait,Decreased range of motion,Difficulty walking,Decreased endurance,Decreased safety awareness,Decreased activity tolerance,Pain,Decreased balance,Decreased mobility,Decreased strength,Increased edema  Visit Diagnosis: Chronic pain of right knee  Difficulty in walking, not elsewhere classified  Other abnormalities of gait and mobility  Muscle weakness (generalized)  Stiffness of left knee, not elsewhere classified     Problem List Patient Active Problem List   Diagnosis Date Noted  . 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 Unilateral primary osteoarthritis, left knee 07/19/2020  . Unilateral primary osteoarthritis, right knee 07/19/2020  . Diabetes mellitus without complication (HCC) 03/29/2020  . Pseudophakia 03/29/2020  . Posterior vitreous detachment of both eyes 03/29/2020  . 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 Headache syndrome 06/25/2018    Alphonzo Severance PT, DPT 12/26/20 10:49 AM  [facility] [address] [place], [place], [address] Phone: [phone]   Fax:  [phone]  Name: Barbarajean Kinzler MRN: [id number] Date of Birth: 12/25/1945

## 2020-12-28 ENCOUNTER — Other Ambulatory Visit: Payer: Self-pay

## 2020-12-28 ENCOUNTER — Ambulatory Visit: Payer: Medicare HMO | Admitting: Physical Therapy

## 2020-12-28 ENCOUNTER — Encounter: Payer: Self-pay | Admitting: Physical Therapy

## 2020-12-28 DIAGNOSIS — M25662 Stiffness of left knee, not elsewhere classified: Secondary | ICD-10-CM

## 2020-12-28 DIAGNOSIS — R262 Difficulty in walking, not elsewhere classified: Secondary | ICD-10-CM

## 2020-12-28 DIAGNOSIS — M25561 Pain in right knee: Secondary | ICD-10-CM

## 2020-12-28 DIAGNOSIS — R2689 Other abnormalities of gait and mobility: Secondary | ICD-10-CM

## 2020-12-28 DIAGNOSIS — G8929 Other chronic pain: Secondary | ICD-10-CM

## 2020-12-28 DIAGNOSIS — M6281 Muscle weakness (generalized): Secondary | ICD-10-CM

## 2020-12-28 NOTE — Therapy (Signed)
[facility] Outpatient Rehabilitation [facility] [address] [place], [place], [address] Phone: [phone]   Fax:  [phone]  Physical Therapy Treatment  Patient Details  Name: Joy Fitzgerald MRN: [id number] Date of Birth: [birth year] Referring Provider (PT): Kathryne Hitch, MD   Encounter Date: 12/28/2020   PT End of Session - 12/28/20 0947    Visit Number 8    Number of Visits 17    Date for PT Re-Evaluation 02/03/21    Authorization Type [employer] HMO    Progress Note Due on Visit 10    PT Start Time 0945    PT Stop Time 1030    PT Time Calculation (min) 45 min    Activity Tolerance Patient tolerated treatment well    Behavior During Therapy [facility] for tasks assessed/performed           Past Medical History:  Diagnosis Date  . CKD (chronic kidney disease)   . Diabetes mellitus without complication (HCC)   . Dyspnea   . Headache syndrome 06/25/2018  . Hypertension   . Neck pain   . 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 APPENDECTOMY    . EYE SURGERY    . TOTAL KNEE ARTHROPLASTY Right 10/24/2020   Procedure: RIGHT TOTAL KNEE ARTHROPLASTY;  Surgeon: Kathryne Hitch, MD;  Location: [facility] OR;  Service: Orthopedics;  Laterality: Right;    There were no vitals filed for this visit.   Subjective Assessment - 12/28/20 0948    Subjective Pt reports that she forgot to take her pain medication last visit and that is why she was having some more pain.  She feels she is getting stronger.  She is using her cane at home to walk and feels comfotable with this.    Pertinent History R TKA 3/22 with significant ext deficit    Patient Stated Goals To have good use of knee and to get out in the community/shop.    Currently in Pain? Yes    Pain Score 1     Pain Location Knee                             [facility] Adult PT Treatment/Exercise - 12/28/20 0001      Knee/Hip  Exercises: Stretches   Other Knee/Hip Stretches 2 chair hamstring stretch c quad sets; reaching R hand toward toes and L hand stabilizing the R knee      Knee/Hip Exercises: Aerobic   Nustep 6 mins; L6; LEs only      Knee/Hip Exercises: Standing   Other Standing Knee Exercises resisted cable walk fwd/bkwd, 7# x4      Knee/Hip Exercises: Seated   Long Arc Quad --    Long Arc Quad Weight 6 lbs.    Long Arc Quad Limitations alternating - 3x8      Knee/Hip Exercises: Supine   Straight Leg Raises Limitations 3x10                    PT Short Term Goals - 12/21/20 1153      PT SHORT TERM GOAL #1   Title Pt will be Ind c an HEP with emphasis to improve r knee ext ROM    Baseline started on eval    Status Achieved    Target Date 12/21/20      PT SHORT TERM GOAL #2   Title Pt  will voice measures to assist with symptom management    Status Achieved    Target Date 12/21/20      PT SHORT TERM GOAL #3   Status On-going    Target Date 12/30/20             PT Long Term Goals - 11/29/20 1021      PT LONG TERM GOAL #1   Title Pt will demonstrate increased R knee ext ROM to -3d or less for improve functional use of the R knee and gait quality    Baseline -17d    Status New    Target Date 02/03/21      PT LONG TERM GOAL #2   Title Increase bilat hip strength to 4/5 and knee strength to 5/5 for improved balance, function, and safety    Baseline see flowsheets    Status New    Target Date 02/03/21      PT LONG TERM GOAL #3   Title Improve TUG time c or s an AD to 12 sec as demonstration of improved functional mobility    Baseline 17.4 sce c RW    Status New    Target Date 02/03/21      PT LONG TERM GOAL #4   Title Improve 5xSTS c armrests to 20 sec or less as demonstration of improved functional mobility    Baseline 26.8 sec    Status New    Target Date 02/03/21      PT LONG TERM GOAL #5   Title Improve pt's quality of gait to 103ft c a minimal to no limp over  the R LE c a LRAD for her to complete community ambulation Indly    Status New    Target Date 02/03/21      Additional Long Term Goals   Additional Long Term Goals Yes      PT LONG TERM GOAL #6   Title Pt will be Ind in a final HEP to maintain or progress achieved level of function    Status New    Target Date 02/03/21                 Plan - 12/28/20 1002    Clinical Impression Statement Pt is progressing well.  She has good carryover from gait training last visit with improved upright posture.  Today we worked on resisted walking with cable machine with Min A.  She does well with this with brief seated rest breaks between directions.  She did not enjoy this very much d/t the high level of challenge.  We will continue to progress gait, strength, and begin working on balance as able.    Personal Factors and Comorbidities Age;Fitness;Past/Current Experience;Social Background;Comorbidity 1    Comorbidities DM    Examination-Activity Limitations Locomotion Level;Stairs;Transfers    Examination-Participation Restrictions Community Activity    Stability/Clinical Decision Making Stable/Uncomplicated    Rehab Potential Excellent    PT Frequency 2x / week    PT Duration 8 weeks    PT Treatment/Interventions ADLs/Self Care Home Management;Cryotherapy;Electrical Stimulation;Iontophoresis 4mg /ml Dexamethasone;Moist Heat;Ultrasound;Balance training;Therapeutic exercise;Therapeutic activities;Functional mobility training;Stair training;Gait training;Patient/family education;Manual techniques;Passive range of motion;Dry needling;Taping;Vasopneumatic Device;Joint Manipulations    PT Next Visit Plan Will continue working on ext ROM, LE strenghtening, and standing tolerance moving toward balance and ambulation with SPC as appropriate.    PT Home Exercise Plan [id number]    Consulted and Agree with Plan of Care Patient  Patient will benefit from skilled therapeutic intervention in order  to improve the following deficits and impairments:  Abnormal gait,Decreased range of motion,Difficulty walking,Decreased endurance,Decreased safety awareness,Decreased activity tolerance,Pain,Decreased balance,Decreased mobility,Decreased strength,Increased edema  Visit Diagnosis: Chronic pain of right knee  Difficulty in walking, not elsewhere classified  Other abnormalities of gait and mobility  Muscle weakness (generalized)  Stiffness of left knee, not elsewhere classified     Problem List Patient Active Problem List   Diagnosis Date Noted  . 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 Unilateral primary osteoarthritis, left knee 07/19/2020  . Unilateral primary osteoarthritis, right knee 07/19/2020  . Diabetes mellitus without complication (HCC) 03/29/2020  . Pseudophakia 03/29/2020  . Posterior vitreous detachment of both eyes 03/29/2020  . 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 Headache syndrome 06/25/2018    Alphonzo Severance PT, DPT 12/28/20 10:36 AM  [facility] [address] [place], [place], [address] Phone: [phone]   Fax:  [phone]  Name: Joy Fitzgerald MRN: [id number] Date of Birth: Mar 05, 1946

## 2021-01-02 ENCOUNTER — Encounter: Payer: Self-pay | Admitting: Physical Therapy

## 2021-01-02 ENCOUNTER — Other Ambulatory Visit: Payer: Self-pay

## 2021-01-02 ENCOUNTER — Ambulatory Visit: Payer: Medicare HMO | Admitting: Physical Therapy

## 2021-01-02 DIAGNOSIS — G8929 Other chronic pain: Secondary | ICD-10-CM

## 2021-01-02 DIAGNOSIS — M6281 Muscle weakness (generalized): Secondary | ICD-10-CM

## 2021-01-02 DIAGNOSIS — M25662 Stiffness of left knee, not elsewhere classified: Secondary | ICD-10-CM

## 2021-01-02 DIAGNOSIS — M25561 Pain in right knee: Secondary | ICD-10-CM | POA: Diagnosis not present

## 2021-01-02 DIAGNOSIS — R262 Difficulty in walking, not elsewhere classified: Secondary | ICD-10-CM

## 2021-01-02 DIAGNOSIS — R2689 Other abnormalities of gait and mobility: Secondary | ICD-10-CM

## 2021-01-02 NOTE — Therapy (Signed)
[facility] [address] [place], [place], [address] Phone: [phone]   Fax:  [phone]  Physical Therapy Treatment  Patient Details  Name: Jawana Reagor MRN: [id number] Date of Birth: 1946/05/01 Referring Provider (PT): Kathryne Hitch, MD   Encounter Date: 01/02/2021   PT End of Session - 01/02/21 0945    Visit Number 9    Number of Visits 17    Date for PT Re-Evaluation 02/03/21    Authorization Type [employer]IC[employer] HMO    Progress Note Due on Visit 10    PT Start Time 0945    PT Stop Time 1030    PT Time Calculation (min) 45 min    Activity Tolerance Patient tolerated treatment well    Behavior During Therapy [facility] for tasks assessed/performed           Past Medical History:  Diagnosis Date  . CKD (chronic kidney disease)   . Diabetes mellitus without complication (HCC)   . Dyspnea   . Headache syndrome 06/25/2018  . Hypertension   . Neck pain   . 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 APPENDECTOMY    . EYE SURGERY    . TOTAL KNEE ARTHROPLASTY Right 10/24/2020   Procedure: RIGHT TOTAL KNEE ARTHROPLASTY;  Surgeon: Kathryne Hitch, MD;  Location: [facility] OR;  Service: Orthopedics;  Laterality: Right;    There were no vitals filed for this visit.   Subjective Assessment - 01/02/21 0946    Subjective Pt reports that she is doing well overall.  She is having minimal pain during the day and almost no pain at night.    Pertinent History R TKA 3/22 with significant ext deficit    Patient Stated Goals To have good use of knee and to get out in the community/shop.    Currently in Pain? No/denies              [facility] PT Assessment - 01/02/21 0001      AROM   Right Knee Extension 2   lacking                        [facility] Adult PT Treatment/Exercise - 01/02/21 0001      Knee/Hip Exercises: Aerobic   Recumbent Bike 6' no  level set      Knee/Hip Exercises: Standing   Gait Training ambulating with SPC 280' with no rest; CGA, cuing for sequencing    Other Standing Knee Exercises march in // dynamic with UE support - 3x10 ea with brief seated rest breaks    Other Standing Knee Exercises lateral walking in // - YTB at knees 2x3 laps      Knee/Hip Exercises: Seated   Long Arc Quad Weight 8 lbs.    Long Arc Quad Limitations 3x10                    PT Short Term Goals - 01/02/21 0951      PT SHORT TERM GOAL #1   Title Pt will be Ind c an HEP with emphasis to improve r knee ext ROM    Baseline started on eval    Status Achieved    Target Date 12/21/20      PT SHORT TERM GOAL #2   Title Pt will voice measures to assist with symptom management    Status Achieved    Target Date 12/21/20  PT SHORT TERM GOAL #3   Status On-going    Target Date 12/30/20             PT Long Term Goals - 11/29/20 1021      PT LONG TERM GOAL #1   Title Pt will demonstrate increased R knee ext ROM to -3d or less for improve functional use of the R knee and gait quality    Baseline -17d    Status New    Target Date 02/03/21      PT LONG TERM GOAL #2   Title Increase bilat hip strength to 4/5 and knee strength to 5/5 for improved balance, function, and safety    Baseline see flowsheets    Status New    Target Date 02/03/21      PT LONG TERM GOAL #3   Title Improve TUG time c or s an AD to 12 sec as demonstration of improved functional mobility    Baseline 17.4 sce c RW    Status New    Target Date 02/03/21      PT LONG TERM GOAL #4   Title Improve 5xSTS c armrests to 20 sec or less as demonstration of improved functional mobility    Baseline 26.8 sec    Status New    Target Date 02/03/21      PT LONG TERM GOAL #5   Title Improve pt's quality of gait to 1079ft c a minimal to no limp over the R LE c a LRAD for her to complete community ambulation Indly    Status New    Target Date 02/03/21       Additional Long Term Goals   Additional Long Term Goals Yes      PT LONG TERM GOAL #6   Title Pt will be Ind in a final HEP to maintain or progress achieved level of function    Status New    Target Date 02/03/21                 Plan - 01/02/21 1014    Clinical Impression Statement Pt progressing well.  Not adverse reaction to therapy.  Began dynamic hip flexion to improve single leg stability and power with hip flexion during ambulation.  Pt fatigues but has no increase in pain.  Will need to continue to address lateral hip strength as well.    Personal Factors and Comorbidities Age;Fitness;Past/Current Experience;Social Background;Comorbidity 1    Comorbidities DM    Examination-Activity Limitations Locomotion Level;Stairs;Transfers    Examination-Participation Restrictions Community Activity    Stability/Clinical Decision Making Stable/Uncomplicated    Rehab Potential Excellent    PT Frequency 2x / week    PT Duration 8 weeks    PT Treatment/Interventions ADLs/Self Care Home Management;Cryotherapy;Electrical Stimulation;Iontophoresis 4mg /ml Dexamethasone;Moist Heat;Ultrasound;Balance training;Therapeutic exercise;Therapeutic activities;Functional mobility training;Stair training;Gait training;Patient/family education;Manual techniques;Passive range of motion;Dry needling;Taping;Vasopneumatic Device;Joint Manipulations    PT Next Visit Plan Will continue working on ext ROM, LE strenghtening, and standing tolerance moving toward balance and ambulation with SPC as appropriate.    PT Home Exercise Plan [id number]    Consulted and Agree with Plan of Care Patient           Patient will benefit from skilled therapeutic intervention in order to improve the following deficits and impairments:  Abnormal gait,Decreased range of motion,Difficulty walking,Decreased endurance,Decreased safety awareness,Decreased activity tolerance,Pain,Decreased balance,Decreased mobility,Decreased  strength,Increased edema  Visit Diagnosis: Chronic pain of right knee  Difficulty in walking, not elsewhere classified  Other  abnormalities of gait and mobility  Muscle weakness (generalized)  Stiffness of left knee, not elsewhere classified     Problem List Patient Active Problem List   Diagnosis Date Noted  . 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 Unilateral primary osteoarthritis, left knee 07/19/2020  . Unilateral primary osteoarthritis, right knee 07/19/2020  . Diabetes mellitus without complication (HCC) 03/29/2020  . Pseudophakia 03/29/2020  . Posterior vitreous detachment of both eyes 03/29/2020  . 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 Headache syndrome 06/25/2018    Alphonzo Severance PT, DPT 01/02/21 10:34 AM  [facility] [address] [place], [place], [address] Phone: [phone]   Fax:  [phone]  Name: Jhordyn Hoopingarner MRN: [id number] Date of Birth: 10-15-1945

## 2021-01-04 ENCOUNTER — Other Ambulatory Visit: Payer: Self-pay

## 2021-01-04 ENCOUNTER — Ambulatory Visit: Payer: Medicare HMO | Attending: Orthopaedic Surgery | Admitting: Physical Therapy

## 2021-01-04 ENCOUNTER — Encounter: Payer: Self-pay | Admitting: Physical Therapy

## 2021-01-04 DIAGNOSIS — M25561 Pain in right knee: Secondary | ICD-10-CM | POA: Diagnosis present

## 2021-01-04 DIAGNOSIS — G8929 Other chronic pain: Secondary | ICD-10-CM

## 2021-01-04 DIAGNOSIS — R262 Difficulty in walking, not elsewhere classified: Secondary | ICD-10-CM | POA: Diagnosis present

## 2021-01-04 DIAGNOSIS — M6281 Muscle weakness (generalized): Secondary | ICD-10-CM | POA: Diagnosis present

## 2021-01-04 DIAGNOSIS — R2689 Other abnormalities of gait and mobility: Secondary | ICD-10-CM | POA: Diagnosis present

## 2021-01-04 NOTE — Therapy (Addendum)
[place], [place], [address] Phone: [phone]   Fax:  [phone]  Physical Therapy Treatment  Progress Note Reporting Period 4/26 to 6/2  See note below for Objective Data and Assessment of Progress/Goals.    Patient Details  Name: Joy Fitzgerald MRN: [id number] Date of Birth: 05-17-1946 Referring Provider (PT): Mcarthur Rossetti, MD   Encounter Date: 01/04/2021   PT End of Session - 01/04/21 1002    Visit Number 10    Number of Visits 17    Date for PT Re-Evaluation 02/03/21    Authorization Type [employer]IC[employer] HMO    Progress Note Due on Visit 10    Activity Tolerance Patient tolerated treatment well    Behavior During Therapy [facility] for tasks assessed/performed         Start time: 10:00 a End time: 10:44 a Treatment time: 44 min  Past Medical History:  Diagnosis Date  . CKD (chronic kidney disease)   . Diabetes mellitus without complication ([place])   . Dyspnea   . Headache syndrome 06/25/2018  . Hypertension   . Neck pain   . 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 APPENDECTOMY    . EYE SURGERY    . TOTAL KNEE ARTHROPLASTY Right 10/24/2020   Procedure: RIGHT TOTAL KNEE ARTHROPLASTY;  Surgeon: Mcarthur Rossetti, MD;  Location: [place];  Service: Orthopedics;  Laterality: Right;    There were no vitals filed for this visit.   Subjective Assessment - 01/04/21 1002    Subjective Pt reports that she very happy with her progress.  She feels she is geting close to baseline.  She has started driving and was able to walk around [employer].    Pertinent History R TKA 3/22 with significant ext deficit    Patient Stated Goals To have good use of knee and to get out in the community/shop.    Pain Score 1     Pain Location Knee    Pain Orientation Right              [facility] PT Assessment - 01/04/21 0001      Strength    Right Hip Flexion 3+/5    Right Knee Flexion 4/5    Right Knee Extension 4+/5      Transfers   Five time sit to stand comments  20''                         [facility] Adult PT Treatment/Exercise - 01/04/21 0001      Therapeutic Activites    Therapeutic Activities --   checking goals including 5x sts, TUG, walk test, and MMT     Knee/Hip Exercises: Machines for Strengthening   Total Gym Leg Press 4x5 @ 10#      Knee/Hip Exercises: Seated   Long Arc Quad Weight 10 lbs.    Long Arc Quad Limitations 3x10    Marching Limitations 3x10 0#                    PT Short Term Goals - 01/04/21 1003      PT SHORT TERM GOAL #1   Title Pt will be Ind c an HEP with emphasis to improve r knee ext ROM    Baseline started on eval    Status Achieved    Target Date 12/21/20  PT SHORT TERM GOAL #2   Title Pt will voice measures to assist with symptom management    Status Achieved    Target Date 12/21/20      PT SHORT TERM GOAL #3   Status On-going    Target Date 12/30/20             PT Long Term Goals - 01/04/21 1004      PT LONG TERM GOAL #1   Title Pt will demonstrate increased R knee ext ROM to -3d or less for improve functional use of the R knee and gait quality    Baseline -17d - 2 degrees 01/04/2021    Status Achieved      PT LONG TERM GOAL #2   Title Increase bilat hip strength to 4/5 and knee strength to 5/5 for improved balance, function, and safety    Baseline see flowsheets    Status Partially Met      PT LONG TERM GOAL #3   Title Improve TUG time c or s an AD to 12 sec as demonstration of improved functional mobility    Baseline 17.4 sce c RW - 14 sec no AD 6/2    Status New      PT LONG TERM GOAL #4   Title Improve 5xSTS c armrests to 20 sec or less as demonstration of improved functional mobility    Baseline 20''    Status Achieved      PT LONG TERM GOAL #5   Title Improve pt's quality of gait to 1055f c a minimal to no limp over the  R LE c a LRAD for her to complete community ambulation [place]   Baseline 680' 6/2 w/ FWW    Status Partially Met      PT LONG TERM GOAL #6   Title Pt will be Ind in a final HEP to maintain or progress achieved level of function    Status New                 Plan - 01/04/21 1024    Clinical Impression Statement Pt is progressing well with therapy.  She has achieved her ROM goals.  She has significantly improved her knee strength, her distance ambulated, her TUG score, and her 5x STS score.  She has improved functionally in ability to shop, drive, and complete ADLs at home.  Significant remaining defictis include hip strength.  This shoulde be a major focus moving forward.    Personal Factors and Comorbidities Age;Fitness;Past/Current Experience;Social Background;Comorbidity 1    Comorbidities DM    Examination-Activity Limitations Locomotion Level;Stairs;Transfers    Examination-Participation Restrictions Community Activity    Stability/Clinical Decision Making Stable/Uncomplicated    Rehab Potential Excellent    PT Frequency 2x / week    PT Duration 8 weeks    PT Treatment/Interventions ADLs/Self Care Home Management;Cryotherapy;Electrical Stimulation;Iontophoresis 456mml Dexamethasone;Moist Heat;Ultrasound;Balance training;Therapeutic exercise;Therapeutic activities;Functional mobility training;Stair training;Gait training;Patient/family education;Manual techniques;Passive range of motion;Dry needling;Taping;Vasopneumatic Device;Joint Manipulations    PT Next Visit Plan Will continue working on ext ROM, LE strenghtening, and standing tolerance moving toward balance and ambulation with SPC as appropriate.    PT Home Exercise Plan [id number]    Consulted and Agree with Plan of Care Patient           Patient will benefit from skilled therapeutic intervention in order to improve the following deficits and impairments:  Abnormal gait,Decreased range of motion,Difficulty  walking,Decreased endurance,Decreased safety awareness,Decreased activity tolerance,Pain,Decreased balance,Decreased mobility,Decreased strength,Increased edema  Visit Diagnosis: Chronic pain of right knee  Difficulty in walking, not elsewhere classified  Other abnormalities of gait and mobility  Muscle weakness (generalized)     Problem List Patient Active Problem List   Diagnosis Date Noted  . 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 Unilateral primary osteoarthritis, left knee 07/19/2020  . Unilateral primary osteoarthritis, right knee 07/19/2020  . Diabetes mellitus without complication ([place]) 96/06/6434  . Pseudophakia 03/29/2020  . Posterior vitreous detachment of both eyes 03/29/2020  . 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 Headache syndrome 06/25/2018    Shearon Balo PT, DPT 01/04/21 10:45 AM  [facility] [address] [place], [place], [address] Phone: [phone]   Fax:  [phone]  Name: Sandrine Bloodsworth MRN: [id number] Date of Birth: 03/23/1946

## 2021-01-09 ENCOUNTER — Encounter: Payer: Self-pay | Admitting: Physical Therapy

## 2021-01-09 ENCOUNTER — Ambulatory Visit: Payer: Medicare HMO | Admitting: Physical Therapy

## 2021-01-09 ENCOUNTER — Other Ambulatory Visit: Payer: Self-pay

## 2021-01-09 DIAGNOSIS — R262 Difficulty in walking, not elsewhere classified: Secondary | ICD-10-CM

## 2021-01-09 DIAGNOSIS — R2689 Other abnormalities of gait and mobility: Secondary | ICD-10-CM

## 2021-01-09 DIAGNOSIS — M25561 Pain in right knee: Secondary | ICD-10-CM | POA: Diagnosis not present

## 2021-01-09 DIAGNOSIS — G8929 Other chronic pain: Secondary | ICD-10-CM

## 2021-01-09 DIAGNOSIS — M6281 Muscle weakness (generalized): Secondary | ICD-10-CM

## 2021-01-09 NOTE — Therapy (Addendum)
[place], [place], [address] Phone: [phone]   Fax:  [phone]  PHYSICAL THERAPY UNPLANNED DISCHARGE SUMMARY   Visits from Start of Care: 11  Current functional level related to goals / functional outcomes: See progress note from 01/04/21   Remaining deficits: See progress note from 01/04/21   Education / Equipment: Pt has not returned since visit listed below  Patient goals were not assessed. Patient is being discharged due to not returning since the last visit. See progress note from 01/04/21 for most recent evaluation of goals.  (the below note was addended to include the above D/C summary on 03/16/21)  Physical Therapy Treatment  Patient Details  Name: Joy Fitzgerald MRN: [id number] Date of Birth: March 25, 1946 Referring Provider (PT): Mcarthur Rossetti, MD   Encounter Date: 01/09/2021   PT End of Session - 01/09/21 1003     Visit Number 11    Number of Visits 17    Date for PT Re-Evaluation 02/03/21    Authorization Type [employer] HMO    Progress Note Due on Visit 10    PT Start Time 1000    PT Stop Time 1045    PT Time Calculation (min) 45 min             Past Medical History:  Diagnosis Date   CKD (chronic kidney disease)    Diabetes mellitus without complication ([place])    Dyspnea    Headache syndrome 06/25/2018   Hypertension    Neck pain    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   Subjective Assessment - 01/09/21 1004     Subjective Pt reports that she is doing well overall.  She occasionally has a "catch" in her knee, but is having minimal  pain.  She feels like she is making good progress.    Pertinent History R TKA 3/22 with significant ext deficit    Patient Stated Goals To have good use of knee and to get out in the community/shop.    Currently in Pain? No/denies                               [facility] Adult PT Treatment/Exercise - 01/09/21 0001       Ambulation/Gait   Gait Comments --      Knee/Hip Exercises: Aerobic   Nustep 6' no UE L3      Knee/Hip Exercises: Machines for Strengthening   Total Gym Leg Press 3x5 @ 15# R LE only      Knee/Hip Exercises: Standing   Abduction Limitations with YTB at knees 2x10 ea    Gait Training 280' CGA - no AD    Other Standing Knee Exercises marching with YTB at knees 2x10      Knee/Hip Exercises: Seated   Long Arc Quad Weight 10 lbs.    Long [employer] Limitations [id number]      Knee/Hip Exercises: Supine   Other Supine Knee/Hip Exercises clam - YTB - 3x10  PT Short Term Goals - 01/04/21 1003       PT SHORT TERM GOAL #1   Title Pt will be Ind c an HEP with emphasis to improve r knee ext ROM    Baseline started on eval    Status Achieved    Target Date 12/21/20      PT SHORT TERM GOAL #2   Title Pt will voice measures to assist with symptom management    Status Achieved    Target Date 12/21/20      PT SHORT TERM GOAL #3   Status On-going    Target Date 12/30/20               PT Long Term Goals - 01/04/21 1004       PT LONG TERM GOAL #1   Title Pt will demonstrate increased R knee ext ROM to -3d or less for improve functional use of the R knee and gait quality    Baseline -17d - 2 degrees 01/04/2021    Status Achieved      PT LONG TERM GOAL #2   Title Increase bilat hip strength to 4/5 and knee strength to 5/5 for improved balance, function, and safety    Baseline see flowsheets    Status Partially Met      PT LONG TERM GOAL #3   Title Improve TUG time c or s an AD to 12 sec as demonstration of improved  functional mobility    Baseline 17.4 sce c RW - 14 sec no AD 6/2    Status New      PT LONG TERM GOAL #4   Title Improve 5xSTS c armrests to 20 sec or less as demonstration of improved functional mobility    Baseline 20''    Status Achieved      PT LONG TERM GOAL #5   Title Improve pt's quality of gait to 1040f c a minimal to no limp over the R LE c a LRAD for her to complete community ambulation [place]   Baseline 680' 6/2 w/ FWW    Status Partially Met      PT LONG TERM GOAL #6   Title Pt will be Ind in a final HEP to maintain or progress achieved level of function    Status New                   Plan - 01/09/21 1013     Clinical Impression Statement Pt is progresing well with therapy.  We are now concentrating on hip strengthening to improve stability during ambulation.  Pt tolerates session well with fatigue but no increase in sxs.  Pt cued for pacing and form throughout.    Personal Factors and Comorbidities Age;Fitness;Past/Current Experience;Social Background;Comorbidity 1    Comorbidities DM    Examination-Activity Limitations Locomotion Level;Stairs;Transfers    Examination-Participation Restrictions Community Activity    Stability/Clinical Decision Making Stable/Uncomplicated    Rehab Potential Excellent    PT Frequency 2x / week    PT Duration 8 weeks    PT Treatment/Interventions ADLs/Self Care Home Management;Cryotherapy;Electrical Stimulation;Iontophoresis 475mml Dexamethasone;Moist Heat;Ultrasound;Balance training;Therapeutic exercise;Therapeutic activities;Functional mobility training;Stair training;Gait training;Patient/family education;Manual techniques;Passive range of motion;Dry needling;Taping;Vasopneumatic Device;Joint Manipulations    PT Next Visit Plan Will continue working on ext ROM, Quad strength, with focus on hip strengthening.    PT Home Exercise Plan [id number]    Consulted and Agree with Plan of Care Patient  Patient will  benefit from skilled therapeutic intervention in order to improve the following deficits and impairments:  Abnormal gait,Decreased range of motion,Difficulty walking,Decreased endurance,Decreased safety awareness,Decreased activity tolerance,Pain,Decreased balance,Decreased mobility,Decreased strength,Increased edema  Visit Diagnosis: Chronic pain of right knee  Difficulty in walking, not elsewhere classified  Other abnormalities of gait and mobility  Muscle weakness (generalized)     Problem List Patient Active Problem List   Diagnosis Date Noted   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

## 2021-01-11 ENCOUNTER — Encounter: Payer: Medicare HMO | Admitting: Physical Therapy

## 2021-03-29 ENCOUNTER — Other Ambulatory Visit: Payer: Self-pay

## 2021-03-29 ENCOUNTER — Encounter (INDEPENDENT_AMBULATORY_CARE_PROVIDER_SITE_OTHER): Payer: Medicare HMO | Admitting: Ophthalmology

## 2021-03-29 ENCOUNTER — Ambulatory Visit (INDEPENDENT_AMBULATORY_CARE_PROVIDER_SITE_OTHER): Payer: Medicare HMO | Admitting: Ophthalmology

## 2021-03-29 ENCOUNTER — Encounter (INDEPENDENT_AMBULATORY_CARE_PROVIDER_SITE_OTHER): Payer: Self-pay | Admitting: Ophthalmology

## 2021-03-29 DIAGNOSIS — E113293 Type 2 diabetes mellitus with mild nonproliferative diabetic retinopathy without macular edema, bilateral: Secondary | ICD-10-CM

## 2021-03-29 DIAGNOSIS — H43813 Vitreous degeneration, bilateral: Secondary | ICD-10-CM

## 2021-03-29 NOTE — Assessment & Plan Note (Signed)
The nature of mild nonproliferative diabetic retinopathy was discussed with the patient. Emphasis was placed on tight glucose, blood pressure, and serum lipid control. Avoidance of smoking was emphasized. Maintenance of normal body weight was emphasized. Appropriate follow up dilated exam is 1 year. 

## 2021-03-29 NOTE — Assessment & Plan Note (Signed)

## 2021-03-29 NOTE — Progress Notes (Signed)
03/29/2021     CHIEF COMPLAINT Patient presents for  Chief Complaint  Patient presents with   Retina Follow Up      HISTORY OF PRESENT ILLNESS: Joy Fitzgerald is a 75 y.o. female who presents to the clinic today for:   HPI     Retina Follow Up   Patient presents with  Other.  In both eyes.  This started 1 year ago.  Severity is mild.  Duration of 1 year.  Since onset it is gradually improving.        Comments   1 year fu OU and OCT Pt states, "I feel like my vision is better since I last seen you because I did have cataract sx since then in one eye but I am not sure which eye." A1C: 6.0 "or something" LBS:092      Last edited by Demetrios Loll, COA on 03/29/2021  8:52 AM.      Referring physician: [name], MD [address] [place],  [place] [address]  HISTORICAL INFORMATION:   Selected notes from the MEDICAL RECORD NUMBER    Lab Results  Component Value Date   HGBA1C 6.3 (H) 10/20/2020     CURRENT MEDICATIONS: No current outpatient medications on file. (Ophthalmic Drugs)   No current facility-administered medications for this visit. (Ophthalmic Drugs)   Current Outpatient Medications (Other)  Medication Sig   ALPRAZolam (XANAX) 1 MG tablet Take 1 mg by mouth See admin instructions. Take 1 mg by mouth at bedtime and an additional 1 mg up to two times a day as needed for anxiety   aspirin 81 MG chewable tablet Chew 1 tablet (81 mg total) by mouth 2 (two) times daily.   HYDROmorphone (DILAUDID) 2 MG tablet Take 1 tablet (2 mg total) by mouth every 4 (four) hours as needed for severe pain. (Patient not taking: Reported on 11/29/2020)   losartan (COZAAR) 100 MG tablet Take 100 mg by mouth daily.   metFORMIN (GLUCOPHAGE) 500 MG tablet Take 500 mg by mouth 2 (two) times daily with a meal.   methocarbamol (ROBAXIN) 500 MG tablet Take 1 tablet (500 mg total) by mouth every 6 (six) hours as needed for muscle spasms. (Patient not taking: Reported on 11/29/2020)    nebivolol (BYSTOLIC) 10 MG tablet Take 10 mg by mouth daily.   omeprazole (PRILOSEC) 20 MG capsule Take 20 mg by mouth daily before breakfast.   pravastatin (PRAVACHOL) 40 MG tablet Take 40 mg by mouth daily after lunch.   No current facility-administered medications for this visit. 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 No pertinent family history.  SOCIAL HISTORY Social History   Tobacco Use   Smoking status: Former    Packs/day: 1.00    Types: Cigarettes  Quit date: 10/20/2016    Years since quitting: 4.4   Smokeless tobacco: Never  Vaping Use   Vaping Use: Never used  Substance Use Topics   Alcohol use: Not Currently   Drug use: Never         OPHTHALMIC EXAM:  Base Eye Exam     Visual Acuity (ETDRS)       Right Left   Dist [place] 20/50 -1 20/60   Dist ph [place] 20/25 -1 20/25 -1         Tonometry (Tonopen, 8:55 AM)       Right Left   Pressure 19 20         Pupils       Pupils Dark Light Shape React APD   Right PERRL 2 2 Round Minimal None   Left PERRL 2 2 Round Minimal None         Visual Fields (Counting fingers)       Left Right    Full Full         Extraocular Movement       Right Left    Full Full         Neuro/Psych     Oriented x3: Yes   Mood/Affect: Normal         Dilation      Both eyes: 1.0% Mydriacyl, 2.5% Phenylephrine @ 8:55 AM           Slit Lamp and Fundus Exam     External Exam       Right Left   External Normal Normal         Slit Lamp Exam       Right Left   Lids/Lashes Normal Normal   Conjunctiva/Sclera White and quiet White and quiet   Cornea Clear Clear   Anterior Chamber Deep and quiet Deep and quiet   Iris Round and reactive Round and reactive   Lens Posterior chamber intraocular lens Posterior chamber intraocular lens   Anterior Vitreous Normal Normal         Fundus Exam       Right Left   Posterior Vitreous Posterior vitreous detachment Posterior vitreous detachment   Disc Normal Normal   C/D Ratio 0.3 0.3   Macula Normal Normal   Vessels DR, NPDR- Mild, scattered microaneurysms peripherally DR, NPDR- Mild   Periphery Normal Normal            IMAGING AND PROCEDURES  Imaging and Procedures for 03/29/21  OCT, Retina - OU - Both Eyes       Right Eye Quality was good. Scan locations included subfoveal. Central Foveal Thickness: 271. Progression has been stable. Findings include normal foveal contour.   Left Eye Quality was good. Scan locations included subfoveal. Central Foveal Thickness: 275. Progression has been stable. Findings include normal foveal contour.   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 Emphasis was placed on tight glucose, blood pressure, and serum lipid control. Avoidance of smoking was emphasized. Maintenance of normal body weight was emphasized. Appropriate follow up dilated exam is 1 year.  Posterior vitreous detachment of both eyes  The nature of posterior vitreous detachment was discussed with the patient as well as its physiology, its age prevalence, and its possible implication regarding retinal breaks and detachment.   An informational brochure  was offered to the patient.  All the patient's questions were answered.  The patient was asked to return if new or different flashes or floaters develops.   Patient was instructed to contact office immediately if any new changes were noticed. I explained to the patient that vitreous inside the eye is similar to jello inside a bowl. As the jello melts it can start to pull away from the bowl, similarly the vitreous throughout our lives can begin to pull away from the retina. That process is called a posterior vitreous detachment. In some cases, the vitreous can tug hard enough on the retina to form a retinal tear. I discussed with the patient the signs and symptoms of a retinal detachment.  Do not rub the eye.      ICD-10-CM   1. Posterior vitreous detachment of both eyes  H43.813 OCT, Retina - OU - Both Eyes    2. 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  OU with only mild NPDR.  No risk for severe progression over another 1 year we will thus we will follow-up and dilate OU in 1 year  2.  Minor refractive error present as pinhole acuity notes.  Patient can follow-up with her regular eye doctor as scheduled or as needed  3.  Ophthalmic Meds Ordered this visit:  No orders of the defined types were placed in this encounter.      Return in about 1 year (around 03/29/2022) for DILATE OU, COLOR FP.  There are no Patient Instructions on file for this visit.   Explained the diagnoses, plan, and follow up with the patient and they expressed understanding.  Patient expressed understanding of the importance of proper follow up care.   Alford Highland Kyriaki Moder M.D. Diseases & Surgery of the Retina and Vitreous [facility] 03/29/21     Abbreviations: M myopia (nearsighted); A astigmatism; H hyperopia (farsighted); P presbyopia; Mrx spectacle prescription;   CTL contact lenses; OD right eye; OS left eye; OU both eyes  XT exotropia; ET esotropia; PEK punctate epithelial keratitis; PEE punctate epithelial erosions; DES dry eye syndrome; MGD meibomian gland dysfunction; ATs artificial tears; PFAT's preservative free artificial tears; NSC nuclear sclerotic cataract; PSC posterior subcapsular cataract; ERM epi-retinal membrane; PVD posterior vitreous detachment; RD retinal detachment; DM diabetes mellitus; DR diabetic retinopathy; NPDR non-proliferative diabetic retinopathy; PDR proliferative diabetic retinopathy; CSME clinically significant macular edema; DME diabetic macular edema; dbh dot blot hemorrhages; CWS cotton wool spot; POAG primary open angle glaucoma; C/D cup-to-disc ratio; HVF humphrey visual field; GVF goldmann visual field; OCT optical coherence tomography; IOP intraocular pressure; BRVO Branch retinal vein occlusion; CRVO central retinal vein occlusion; CRAO central retinal artery occlusion; BRAO branch retinal artery occlusion; RT retinal tear; SB scleral buckle; PPV pars plana vitrectomy; VH Vitreous hemorrhage; PRP panretinal laser photocoagulation; IVK intravitreal kenalog; VMT vitreomacular traction; MH Macular hole;  NVD neovascularization of the disc; NVE neovascularization elsewhere; AREDS age related eye disease study; ARMD age related macular degeneration; POAG primary open angle glaucoma; EBMD epithelial/anterior basement membrane dystrophy; ACIOL anterior chamber intraocular lens; IOL intraocular lens; PCIOL posterior chamber intraocular lens; Phaco/IOL phacoemulsification with intraocular lens placement; PRK photorefractive keratectomy; LASIK laser assisted in situ keratomileusis; HTN hypertension; DM diabetes mellitus; COPD chronic obstructive pulmonary disease

## 2021-04-20 ENCOUNTER — Other Ambulatory Visit: Payer: Self-pay | Admitting: Family Medicine

## 2021-04-20 ENCOUNTER — Other Ambulatory Visit: Payer: Self-pay

## 2021-04-20 ENCOUNTER — Ambulatory Visit
Admission: RE | Admit: 2021-04-20 | Discharge: 2021-04-20 | Disposition: A | Discharge status: Home / Self Care | Payer: Medicare HMO | Source: Ambulatory Visit | Attending: Family Medicine | Admitting: Family Medicine

## 2021-04-20 DIAGNOSIS — M25551 Pain in right hip: Secondary | ICD-10-CM

## 2021-06-06 ENCOUNTER — Ambulatory Visit: Payer: Medicare HMO | Admitting: Orthopaedic Surgery

## 2021-06-18 ENCOUNTER — Other Ambulatory Visit: Payer: Self-pay

## 2021-06-18 ENCOUNTER — Encounter: Payer: Self-pay | Admitting: Orthopaedic Surgery

## 2021-06-18 ENCOUNTER — Ambulatory Visit (INDEPENDENT_AMBULATORY_CARE_PROVIDER_SITE_OTHER): Payer: Medicare HMO | Admitting: Orthopaedic Surgery

## 2021-06-18 ENCOUNTER — Ambulatory Visit: Payer: Self-pay

## 2021-06-18 DIAGNOSIS — Z96651 Presence of right artificial knee joint: Secondary | ICD-10-CM

## 2021-06-18 NOTE — Progress Notes (Signed)
The patient is a 75 year old female who is 8 months out from a right total knee arthroplasty.  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  She did have some left knee pain prior to having her right knee replaced.  She says that is now resolved.  She says she is able to walk easily without assistive device and that she is pain-free.  She reports good range of motion and good strength with her right operative knee.  On exam her incision is well-healed on the right knee.  She has full range of motion of that knee and it feels ligamentously stable.  2 views of the right knee show well-seated total knee arthroplasty with no complicating features.  At this point follow-up for the right knee will be as needed.  She understands if she develops swelling or any other issues with that knee do not hesitate to give [place] a call.  If she does have any issues with the left knee she also knows to reach out to [place].  All questions and concerns were answered and addressed.

## 2021-06-27 ENCOUNTER — Other Ambulatory Visit: Payer: Self-pay | Admitting: Internal Medicine

## 2021-06-27 DIAGNOSIS — I129 Hypertensive chronic kidney disease with stage 1 through stage 4 chronic kidney disease, or unspecified chronic kidney disease: Secondary | ICD-10-CM

## 2021-06-27 DIAGNOSIS — N1832 Chronic kidney disease, stage 3b: Secondary | ICD-10-CM

## 2021-08-08 ENCOUNTER — Ambulatory Visit
Admission: RE | Admit: 2021-08-08 | Discharge: 2021-08-08 | Disposition: A | Discharge status: Home / Self Care | Payer: Medicare HMO | Source: Ambulatory Visit | Attending: Internal Medicine | Admitting: Internal Medicine

## 2021-08-08 DIAGNOSIS — I129 Hypertensive chronic kidney disease with stage 1 through stage 4 chronic kidney disease, or unspecified chronic kidney disease: Secondary | ICD-10-CM

## 2021-08-08 DIAGNOSIS — N1832 Chronic kidney disease, stage 3b: Secondary | ICD-10-CM

## 2022-01-04 ENCOUNTER — Other Ambulatory Visit: Payer: Self-pay | Admitting: Internal Medicine

## 2022-01-04 DIAGNOSIS — N1832 Chronic kidney disease, stage 3b: Secondary | ICD-10-CM

## 2022-01-04 DIAGNOSIS — N281 Cyst of kidney, acquired: Secondary | ICD-10-CM

## 2022-01-14 ENCOUNTER — Ambulatory Visit
Admission: RE | Admit: 2022-01-14 | Discharge: 2022-01-14 | Disposition: A | Discharge status: Home / Self Care | Payer: Medicare HMO | Source: Ambulatory Visit | Attending: Internal Medicine | Admitting: Internal Medicine

## 2022-01-14 DIAGNOSIS — N1832 Chronic kidney disease, stage 3b: Secondary | ICD-10-CM

## 2022-01-14 DIAGNOSIS — N281 Cyst of kidney, acquired: Secondary | ICD-10-CM

## 2022-02-14 IMAGING — US US RENAL
1 series · 13 of 25 positions shown · non-contrast
Comparison: None.

CLINICAL DATA: Renal dysfunction

EXAM:
RENAL / URINARY TRACT ULTRASOUND COMPLETE

[Series 1: us renal · 0.22mm/px · 13 of 34 slices shown]
[im 1/34]
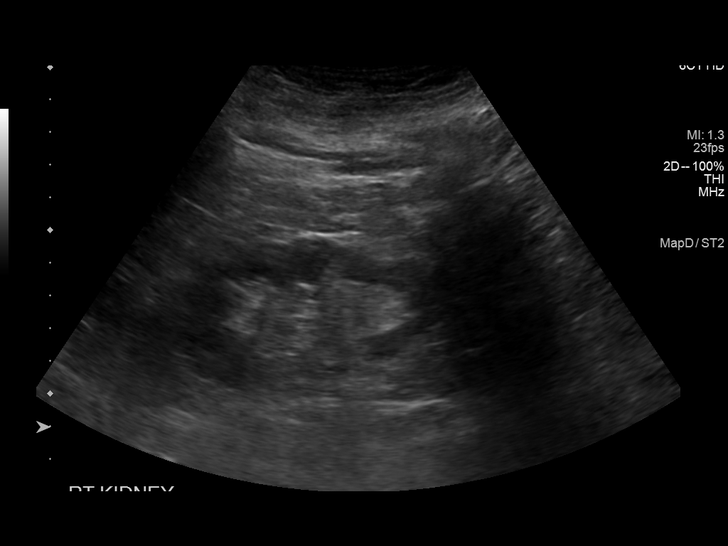
[im 3/34]
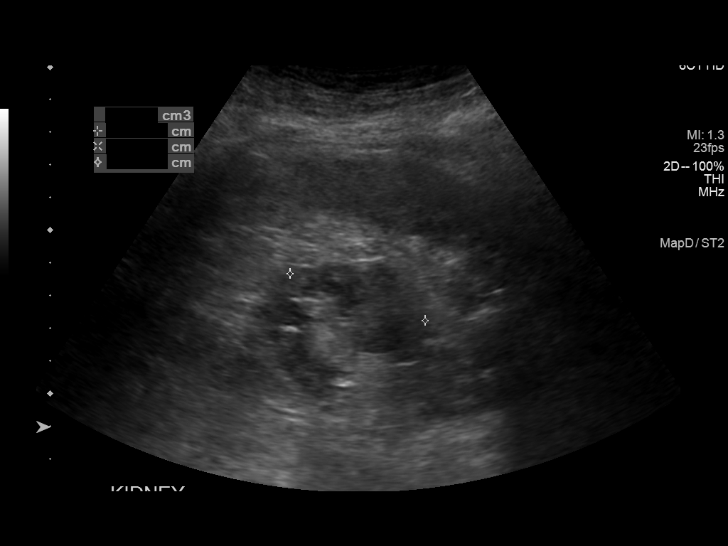
[im 6/34]
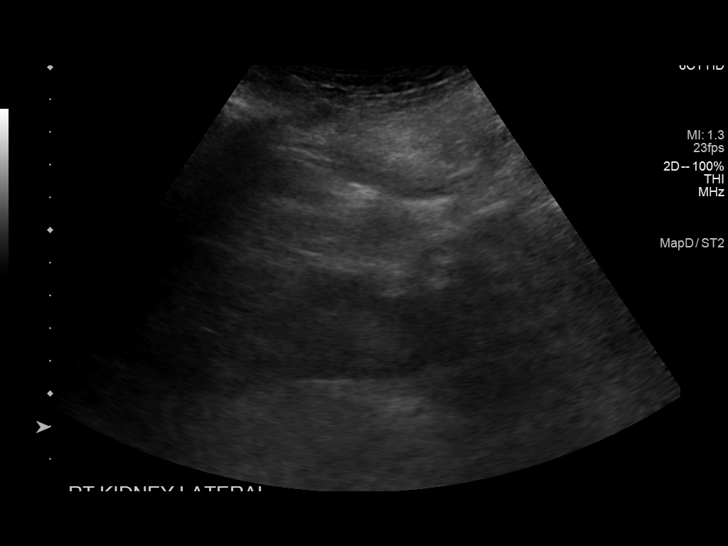
[im 9/34]
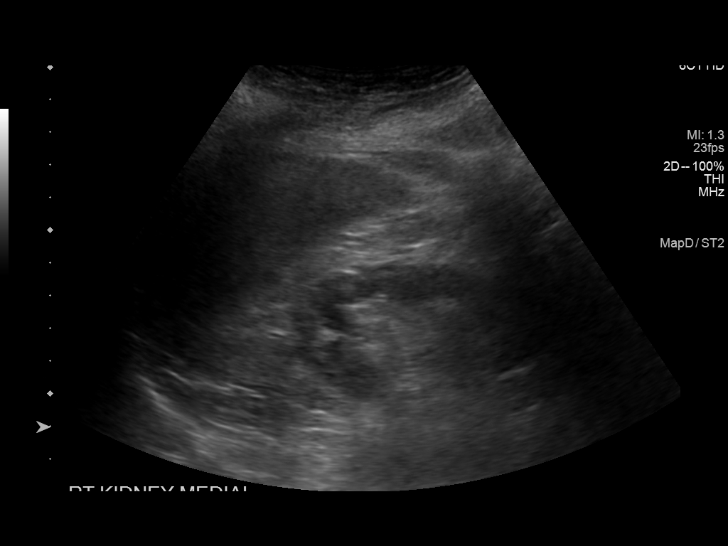
[im 12/34]
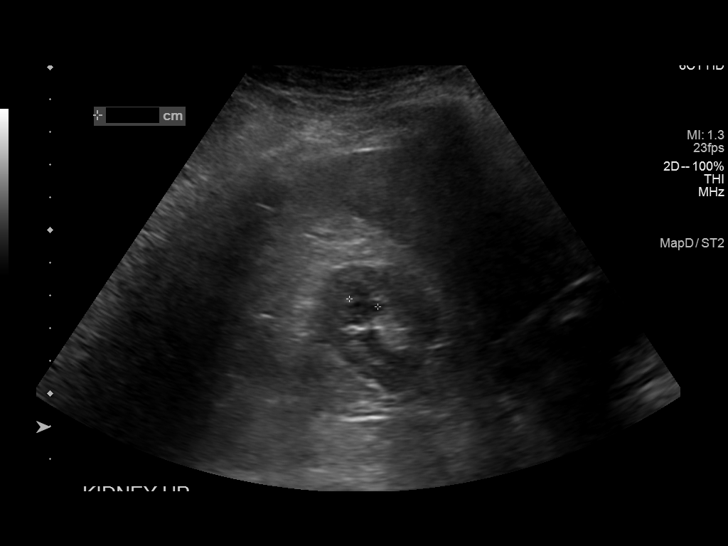
[im 14/34]
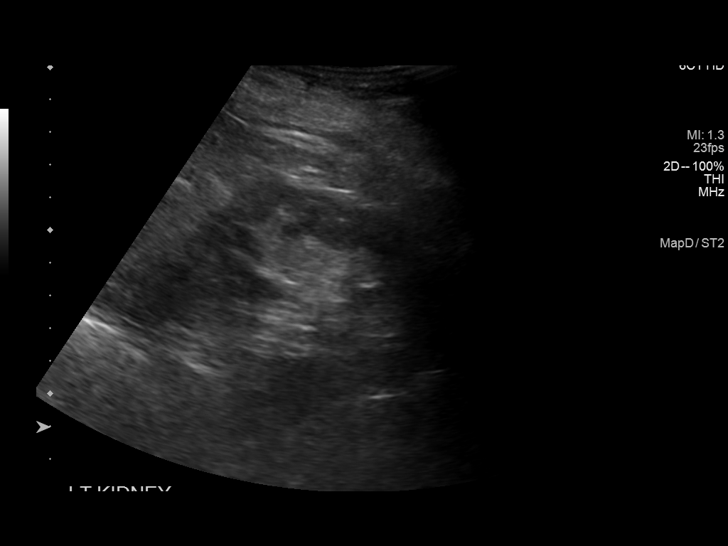
[im 17/34]
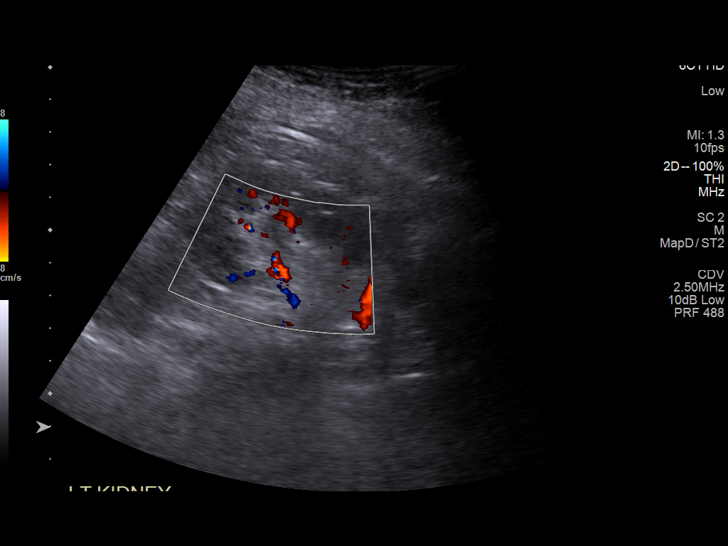
[im 20/34]
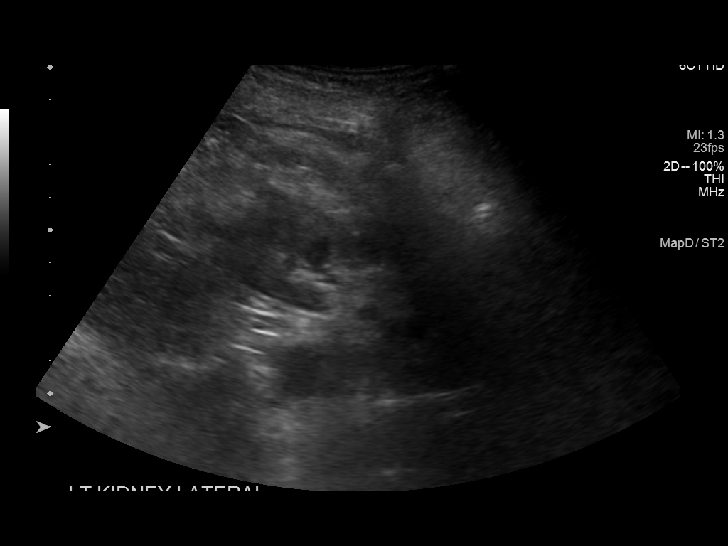
[im 23/34]
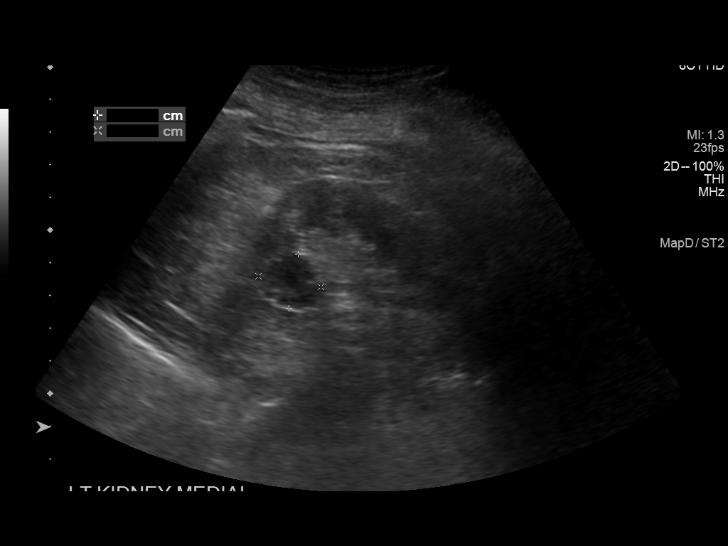
[im 25/34]
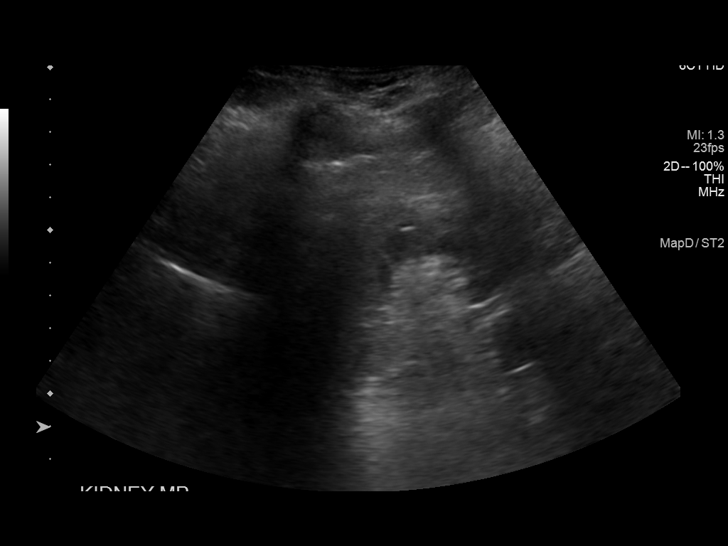
[im 28/34]
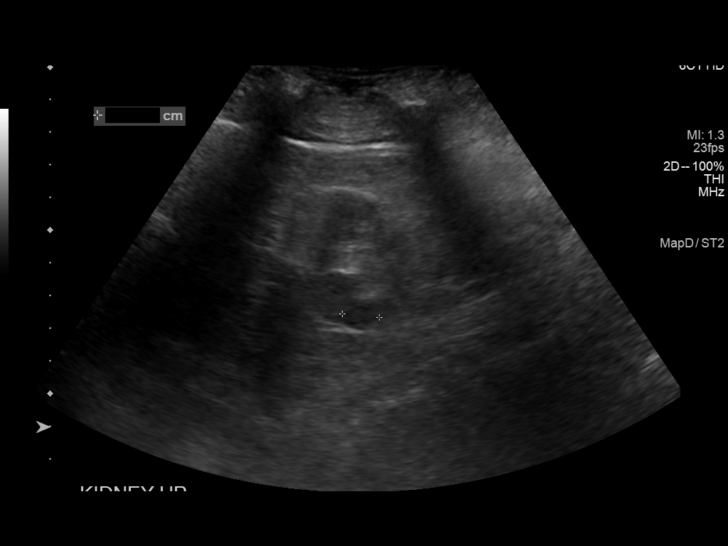
[im 31/34]
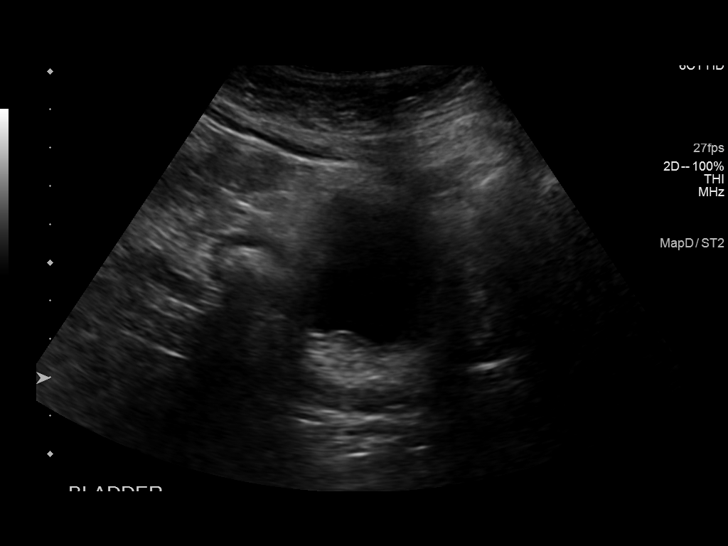
[im 34/34]
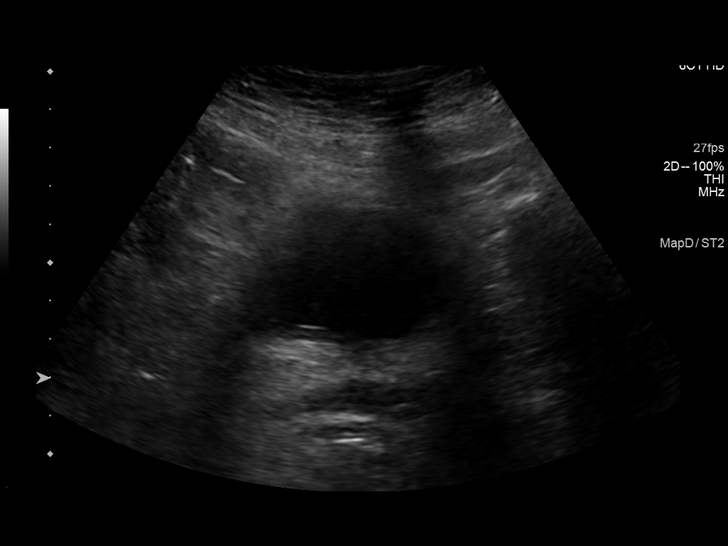

[13 of 25 positions shown; findings below may reference images not displayed]

FINDINGS: Right Kidney:

Renal measurements: 8.8 x 3.4 x 4.4 cm = volume: 68.8 mL. There is
no hydronephrosis. There is slightly increased cortical
echogenicity. There is 8 mm hypoechoic structure in the upper pole.

Left Kidney:

Renal measurements: 8.5 x 4.6 x 4.2 cm = volume: 85.5 mL. There is
increased cortical echogenicity. There is no hydronephrosis. There
is 8 mm smooth marginated hypoechoic lesion in the midportion. There
is 1.9 cm smooth marginated hypoechoic lesion in the medial upper
pole. There are faint internal echoes in the is smooth marginated
lesions which may be a technical artifact or possibly hemorrhagic
cysts.

Bladder:

Appears normal for degree of bladder distention.

Other:

None.
IMPRESSION: There is no hydronephrosis or significant focal cortical thinning.
There is increased cortical echogenicity suggesting medical renal
disease.

There are few small smooth marginated hypoechoic lesions in both
kidneys. Internal echoes are noted in the is lesions which may be a
technical artifact or suggest hemorrhagic cysts. Less likely
possibility would be solid lesions. Comparison with previous studies
or follow-up sonogram and multiphasic CT in the 4-6 months may be
considered.

## 2022-03-08 ENCOUNTER — Emergency Department (HOSPITAL_COMMUNITY)
Admission: EM | Admit: 2022-03-08 | Discharge: 2022-03-08 | Disposition: A | Discharge status: Home / Self Care | Payer: Medicare HMO | Attending: Emergency Medicine | Admitting: Emergency Medicine

## 2022-03-08 ENCOUNTER — Other Ambulatory Visit: Payer: Self-pay

## 2022-03-08 DIAGNOSIS — R109 Unspecified abdominal pain: Secondary | ICD-10-CM | POA: Diagnosis not present

## 2022-03-08 DIAGNOSIS — R42 Dizziness and giddiness: Secondary | ICD-10-CM | POA: Diagnosis present

## 2022-03-08 DIAGNOSIS — Z20822 Contact with and (suspected) exposure to covid-19: Secondary | ICD-10-CM | POA: Insufficient documentation

## 2022-03-08 DIAGNOSIS — R110 Nausea: Secondary | ICD-10-CM | POA: Diagnosis not present

## 2022-03-08 DIAGNOSIS — I129 Hypertensive chronic kidney disease with stage 1 through stage 4 chronic kidney disease, or unspecified chronic kidney disease: Secondary | ICD-10-CM | POA: Diagnosis not present

## 2022-03-08 DIAGNOSIS — E860 Dehydration: Secondary | ICD-10-CM | POA: Insufficient documentation

## 2022-03-08 DIAGNOSIS — N189 Chronic kidney disease, unspecified: Secondary | ICD-10-CM | POA: Diagnosis not present

## 2022-03-08 DIAGNOSIS — E119 Type 2 diabetes mellitus without complications: Secondary | ICD-10-CM | POA: Diagnosis not present

## 2022-03-08 DIAGNOSIS — Z79899 Other long term (current) drug therapy: Secondary | ICD-10-CM | POA: Diagnosis not present

## 2022-03-08 DIAGNOSIS — Z87891 Personal history of nicotine dependence: Secondary | ICD-10-CM | POA: Insufficient documentation

## 2022-03-08 DIAGNOSIS — Z96652 Presence of left artificial knee joint: Secondary | ICD-10-CM | POA: Insufficient documentation

## 2022-03-08 DIAGNOSIS — Z7982 Long term (current) use of aspirin: Secondary | ICD-10-CM | POA: Diagnosis not present

## 2022-03-08 LAB — LIPASE, BLOOD: Lipase: 21 U/L (ref 11–51)

## 2022-03-08 LAB — BASIC METABOLIC PANEL
Anion gap: 10 (ref 5–15)
Anion gap: 7 (ref 5–15)
BUN: 31 mg/dL — ABNORMAL HIGH (ref 8–23)
BUN: 28 mg/dL — ABNORMAL HIGH (ref 8–23)
CO2: 24 mmol/L (ref 22–32)
CO2: 26 mmol/L (ref 22–32)
Calcium: 9.9 mg/dL (ref 8.9–10.3)
Calcium: 9.7 mg/dL (ref 8.9–10.3)
Chloride: 103 mmol/L (ref 98–111)
Chloride: 105 mmol/L (ref 98–111)
Creatinine, Ser: 2.63 mg/dL — ABNORMAL HIGH (ref 0.44–1.00)
Creatinine, Ser: 2.36 mg/dL — ABNORMAL HIGH (ref 0.44–1.00)
GFR, Estimated: 18 mL/min — ABNORMAL LOW (ref >60)
GFR, Estimated: 21 mL/min — ABNORMAL LOW (ref >60)
Glucose, Bld: 127 mg/dL — ABNORMAL HIGH (ref 70–99)
Glucose, Bld: 112 mg/dL — ABNORMAL HIGH (ref 70–99)
Potassium: 3.8 mmol/L (ref 3.5–5.1)
Potassium: 4.6 mmol/L (ref 3.5–5.1)
Sodium: 137 mmol/L (ref 135–145)
Sodium: 138 mmol/L (ref 135–145)

## 2022-03-08 LAB — CBC
HCT: 38.8 % (ref 36.0–46.0)
Hemoglobin: 12.5 g/dL (ref 12.0–15.0)
MCH: 31.3 pg (ref 26.0–34.0)
MCHC: 32.2 g/dL (ref 30.0–36.0)
MCV: 97 fL (ref 80.0–100.0)
Platelets: 233 10*3/uL (ref 150–400)
RBC: 4 MIL/uL (ref 3.87–5.11)
RDW: 12.9 % (ref 11.5–15.5)
WBC: 5.7 10*3/uL (ref 4.0–10.5)
nRBC: 0 % (ref 0.0–0.2)

## 2022-03-08 LAB — URINALYSIS, ROUTINE W REFLEX MICROSCOPIC
Bacteria, UA: NONE SEEN
Bilirubin Urine: NEGATIVE
Glucose, UA: >=500 mg/dL — AB
Ketones, ur: NEGATIVE mg/dL
Nitrite: NEGATIVE
Protein, ur: 30 mg/dL — AB
Specific Gravity, Urine: 1.015 (ref 1.005–1.030)
pH: 5 (ref 5.0–8.0)

## 2022-03-08 LAB — RESP PANEL BY RT-PCR (FLU A&B, COVID) ARPGX2
Influenza A by PCR: NEGATIVE
Influenza B by PCR: NEGATIVE
SARS Coronavirus 2 by RT PCR: NEGATIVE

## 2022-03-08 LAB — TROPONIN I (HIGH SENSITIVITY)
Troponin I (High Sensitivity): 5 ng/L (ref <18)
Troponin I (High Sensitivity): 5 ng/L (ref <18)

## 2022-03-08 LAB — CBG MONITORING, ED
Glucose-Capillary: 107 mg/dL — ABNORMAL HIGH (ref 70–99)
Glucose-Capillary: 132 mg/dL — ABNORMAL HIGH (ref 70–99)

## 2022-03-08 MED ORDER — MECLIZINE HCL 12.5 MG PO TABS: 12.5000 mg | ORAL_TABLET | Freq: Two times a day (BID) | ORAL | 0 refills | Status: AC | PRN

## 2022-03-08 MED ORDER — LACTATED RINGERS IV BOLUS
1000.0000 mL | Freq: Once | INTRAVENOUS | Status: AC
  Administered 2022-03-08: 1000 mL via INTRAVENOUS

## 2022-03-08 MED ORDER — ONDANSETRON 4 MG PO TBDP
4.0000 mg | ORAL_TABLET | Freq: Once | ORAL | Status: AC | PRN
  Administered 2022-03-08: 4 mg via ORAL
  Filled 2022-03-08: qty 1

## 2022-03-08 MED ORDER — MECLIZINE HCL 25 MG PO TABS
12.5000 mg | ORAL_TABLET | Freq: Once | ORAL | Status: AC
  Administered 2022-03-08: 12.5 mg via ORAL
  Filled 2022-03-08: qty 1

## 2022-03-08 NOTE — Discharge Instructions (Addendum)
It was a pleasure caring for you today in the emergency department. ° °Please return to the emergency department for any worsening or worrisome symptoms. ° ° °

## 2022-03-08 NOTE — ED Notes (Signed)
Pt ambulated to the bathroom with standby assist. Pt denies dizziness at this time.

## 2022-03-08 NOTE — ED Triage Notes (Addendum)
Pt arrives via [facility] from home for dizziness that started around 0300 today and generalized abd pain. Pt reports she had 1 episode of vomiting prior to ems arrival. Pt c/o nausea, states when she stands up she feels dizzy, pt reports episode of diaphoresis when dizziness started. Pt refused cbg w/ ems. 154/60, 58 HR, 99% RA.

## 2022-03-08 NOTE — ED Provider Notes (Signed)
[facility] DEPART[facility] Provider Note   CSN: [id number] Arrival date & time: 03/08/22  [id number]     History  Chief Complaint  Patient presents with   Dizziness   Abdominal Pain    Joy Fitzgerald is a 76 y.o. female.  Patient as above with significant medical history as below, including CKD, DM, hypertension, TGA who presents to the ED with complaint of dizziness.  Patient reports this morning when she woke up she got out of bed and felt sudden onset dizziness.  Felt like the room was spinning around her.  She felt nauseated.  She did not vomit.  She sat down and the symptoms improved.  She tried to get up again and the symptoms persisted.  She has not experienced the symptoms in the past.  No recent sinus abnormalities.  No recent head injuries, no neck pain, no headache.  No numbness or tingling, no diplopia or vision changes.  No chest pain or dyspnea.  No abdominal pain, no change in bowel or bladder function, no change in the medications.     Symptoms have improved since initial onset without intervention.     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 No language interpreter was used.  Dizziness Associated symptoms: nausea   Associated symptoms: no chest pain, no headaches, no palpitations, no shortness of breath and no vomiting   Abdominal Pain Associated symptoms: nausea   Associated symptoms: no chest pain, no chills, no cough, no dysuria,  no fever, no shortness of breath and no vomiting        Home Medications Prior to Admission medications   Medication Sig Start Date End Date Taking? Authorizing Provider  meclizine (ANTIVERT) 12.5 MG tablet Take 1 tablet (12.5 mg total) by mouth 2 (two) times daily as needed for dizziness. 03/08/22  Yes Jeanell Sparrow, DO  ALPRAZolam Duanne Moron) 1 MG tablet Take 1 mg by mouth See admin instructions. Take 1 mg by mouth at bedtime and an additional 1 mg up to two times a day as needed for anxiety    [provider]  aspirin 81 MG chewable tablet Chew 1 tablet (81 mg total) by mouth 2 (two) times daily. 10/27/20   Mcarthur Rossetti, MD  HYDROmorphone (DILAUDID) 2 MG tablet Take 1 tablet (2 mg total) by mouth every 4 (four) hours as needed for severe pain. Patient not taking: Reported on 11/29/2020 10/27/20   Mcarthur Rossetti, MD  losartan (COZAAR) 100 MG tablet Take 100 mg by mouth daily.    [provider]  metFORMIN (GLUCOPHAGE) 500 MG tablet Take 500 mg by mouth 2 (two) times daily with a meal.    [provider]  methocarbamol (ROBAXIN) 500 MG tablet Take 1 tablet (500 mg total) by mouth every 6 (six) hours as needed for muscle spasms. Patient not taking: Reported on 11/29/2020 10/27/20   Mcarthur Rossetti,  MD  nebivolol (BYSTOLIC) 10 MG tablet Take 10 mg by mouth daily.    [provider]  omeprazole (PRILOSEC) 20 MG capsule Take 20 mg by mouth daily before breakfast.    [provider]  pravastatin (PRAVACHOL) 40 MG tablet Take 40 mg by mouth daily after lunch.    [provider]      Allergies    Codeine    Review of Systems   Review of Systems  Constitutional:  Negative for activity change, chills and fever.  HENT:  Negative for facial swelling and trouble swallowing.   Eyes:  Negative for discharge and redness.  Respiratory:  Negative for cough and shortness of breath.   Cardiovascular:  Negative for chest pain and  palpitations.  Gastrointestinal:  Positive for nausea. Negative for abdominal pain and vomiting.  Genitourinary:  Negative for dysuria and flank pain.  Musculoskeletal:  Negative for back pain and gait problem.  Skin:  Negative for pallor and rash.  Neurological:  Positive for dizziness. Negative for syncope and headaches.    Physical Exam Updated Vital Signs BP (!) 153/74   Pulse 68   Temp 97.8 F (36.6 C) (Oral)   Resp 17   SpO2 100%  Physical Exam Vitals and nursing note reviewed.  Constitutional:      General: She is not in acute distress.    Appearance: Normal appearance. She is well-developed. She is not ill-appearing or diaphoretic.  HENT:     Head: Normocephalic and atraumatic. No raccoon eyes, Battle's sign, right periorbital erythema or left periorbital erythema.     Right Ear: External ear normal.     Left Ear: External ear normal.     Nose: Nose normal.     Mouth/Throat:     Mouth: Mucous membranes are moist.  Eyes:     General: No scleral icterus.       Right eye: No discharge.        Left eye: No discharge.  Neck:     Vascular: No carotid bruit.     Trachea: Trachea normal.     Meningeal: Brudzinski's sign and Kernig's sign absent.  Cardiovascular:     Rate and Rhythm: Normal rate and regular rhythm.     Pulses: Normal pulses.     Heart sounds: Normal heart sounds.  Pulmonary:     Effort: Pulmonary effort is normal. No respiratory distress.     Breath sounds: Normal breath sounds.  Abdominal:     General: Abdomen is flat.     Palpations: Abdomen is soft.     Tenderness: There is no abdominal tenderness. There is no guarding or rebound.  Musculoskeletal:        General: Normal range of motion.     Cervical back: Full passive range of motion without pain and normal range of motion.     Right lower leg: No edema.     Left lower leg: No edema.  Skin:    General: Skin is warm and dry.     Capillary Refill: Capillary refill takes less than 2 seconds.   Neurological:     Mental Status: She is alert and oriented to person, place, and time.     GCS: GCS eye subscore is 4. GCS verbal subscore is 5. GCS motor subscore is 6.     Cranial Nerves: Cranial nerves 2-12 are intact. No facial asymmetry.     Sensory: Sensation is intact. No sensory deficit.     Motor: Motor function  is intact. No tremor.     Coordination: Coordination is intact. Finger-Nose-Finger Test normal.  Psychiatric:        Mood and Affect: Mood normal.        Behavior: Behavior normal.     ED Results / Procedures / Treatments   Labs (all labs ordered are listed, but only abnormal results are displayed) Labs Reviewed  BASIC METABOLIC PANEL - Abnormal; Notable for the following components:      Result Value   Glucose, Bld 127 (*)    BUN 31 (*)    Creatinine, Ser 2.63 (*)    GFR, Estimated 18 (*)    All other components within normal limits  URINALYSIS, ROUTINE W REFLEX MICROSCOPIC - Abnormal; Notable for the following components:   APPearance CLOUDY (*)    Glucose, UA >=500 (*)    Hgb urine dipstick SMALL (*)    Protein, ur 30 (*)    Leukocytes,Ua SMALL (*)    All other components within normal limits  BASIC METABOLIC PANEL - Abnormal; Notable for the following components:   Glucose, Bld 112 (*)    BUN 28 (*)    Creatinine, Ser 2.36 (*)    GFR, Estimated 21 (*)    All other components within normal limits  CBG MONITORING, ED - Abnormal; Notable for the following components:   Glucose-Capillary 107 (*)    All other components within normal limits  CBG MONITORING, ED - Abnormal; Notable for the following components:   Glucose-Capillary 132 (*)    All other components within normal limits  RESP PANEL BY RT-PCR (FLU A&B, COVID) ARPGX2  CBC  LIPASE, BLOOD  TROPONIN I (HIGH SENSITIVITY)  TROPONIN I (HIGH SENSITIVITY)    EKG EKG Interpretation  Date/Time:  Friday March 08 2022 05:22:45 EDT Ventricular Rate:  62 PR Interval:  128 QRS Duration: 78 QT  Interval:  418 QTC Calculation: 424 R Axis:   -25 Text Interpretation: Normal sinus rhythm Septal infarct , age undetermined Abnormal ECG When compared with ECG of 20-Oct-2020 09:35, PREVIOUS ECG IS PRESENT similar to prior Confirmed by Wynona Dove ([id number]) on 03/08/2022 9:34:20 AM  Radiology No results found.  Procedures Procedures    Medications Ordered in ED Medications  ondansetron (ZOFRAN-ODT) disintegrating tablet 4 mg (4 mg Oral Given 03/08/22 0530)  lactated ringers bolus 1,000 mL (0 mLs Intravenous Stopped 03/08/22 1313)  meclizine (ANTIVERT) tablet 12.5 mg (12.5 mg Oral Given 03/08/22 0908)    ED Course/ Medical Decision Making/ A&P                           Medical Decision Making Amount and/or Complexity of Data Reviewed Labs: ordered.  Risk Prescription drug management.    CC: Dizzy, nausea  This patient presents to the Emergency Department for the above complaint. This involves an extensive number of treatment options and is a complaint that carries with it a high risk of complications and morbidity. Vital signs were reviewed. Serious etiologies considered.  DDx includes not limited to peripheral vertigo, central vertigo, CVA, vascular abnormality, metabolic disturbance, endocrine disturbance, medication effect, infectious, other acute etiologies were considered  Record review:  Previous records obtained and reviewed prior labs, home medications, office visits, prior imaging  Additional history obtained from N/A  Medical and surgical history as noted above.   Work up as above, notable for:  Labs & imaging results that were available during my care of the patient were visualized by  me and considered in my medical decision making.  Physical exam as above.   Cardiac monitoring reviewed and interpreted personally which shows NSR   Labs reviewed, creatinine mildly worsened from her baseline.  Patient reports she has not eaten or drink anything yet this morning.  We  will give IV fluids and repeat BMP.  She has history of CKD.  GFR is mildly worsened from baseline which is around 30.  Today is 18.  Repeat metabolic panel with improvement to creatinine.  She is tolerant p.o. intake without difficulty.  Advised rehydration, follow-up PCP for repeat renal panel.  UA does not demonstrate infection. Management: IVF, antivert, antiemetic  ED Course:     Reassessment:  Improved, she is ambulatory.  No nausea.  No numbness or tingling.  Feels back to baseline.  Admission was considered.    Patient presents with vertigo. On initial evaluation patient appears in no acute distress, afebrile with normal vital signs. Vertigo most suggestive of peripheral cause. Neuro intact without sign of CNS ischemia or other serious etiology. DC on Meclizine with close PCP F/U. Warnings discussed.   Patient in no distress and overall condition improved here in the ED. Detailed discussions were had with the patient regarding current findings, and need for close f/u with PCP or on call doctor. The patient has been instructed to return immediately if the symptoms worsen in any way for re-evaluation. Patient verbalized understanding and is in agreement with current care plan. All questions answered prior to discharge.             Social determinants of health include -  Social History   Socioeconomic History   Marital status: Divorced    Spouse name: Not on file   Number of children: Not on file   Years of education: Not on file   Highest education level: Not on file  Occupational History   Not on file  Tobacco Use   Smoking status: Former    Packs/day: 1.00    Types: Cigarettes    Quit date: 10/20/2016    Years since quitting: 5.3   Smokeless tobacco: Never  Vaping Use   Vaping Use: Never used  Substance and Sexual Activity   Alcohol use: Not Currently   Drug use: Never   Sexual activity: Not on file  Other Topics Concern   Not on file  Social History  Narrative   Not on file   Social Determinants of Health   Financial Resource Strain: Not on file  Food Insecurity: Not on file  Transportation Needs: Not on file  Physical Activity: Not on file  Stress: Not on file  Social Connections: Not on file  Intimate Partner Violence: Not on file      This chart was dictated using voice recognition software.  Despite best efforts to proofread,  errors can occur which can change the documentation meaning.         Final Clinical Impression(s) / ED Diagnoses Final diagnoses:  Vertigo  Mild dehydration    Rx / DC Orders ED Discharge Orders          Ordered    meclizine (ANTIVERT) 12.5 MG tablet  2 times daily PRN        03/08/22 1252              Sloan Leiter, DO 03/10/22 [phone]

## 2022-04-04 ENCOUNTER — Encounter (INDEPENDENT_AMBULATORY_CARE_PROVIDER_SITE_OTHER): Payer: Medicare HMO | Admitting: Ophthalmology

## 2022-04-24 ENCOUNTER — Ambulatory Visit (INDEPENDENT_AMBULATORY_CARE_PROVIDER_SITE_OTHER): Payer: Medicare HMO | Admitting: Ophthalmology

## 2022-04-24 ENCOUNTER — Encounter (INDEPENDENT_AMBULATORY_CARE_PROVIDER_SITE_OTHER): Payer: Self-pay | Admitting: Ophthalmology

## 2022-04-24 DIAGNOSIS — H43813 Vitreous degeneration, bilateral: Secondary | ICD-10-CM | POA: Diagnosis not present

## 2022-04-24 DIAGNOSIS — E113293 Type 2 diabetes mellitus with mild nonproliferative diabetic retinopathy without macular edema, bilateral: Secondary | ICD-10-CM

## 2022-04-24 NOTE — Assessment & Plan Note (Signed)
Physiologic OU 

## 2022-04-24 NOTE — Progress Notes (Signed)
04/24/2022     CHIEF COMPLAINT Patient presents for  Chief Complaint  Patient presents with   Diabetic Retinopathy without Macular Edema      HISTORY OF PRESENT ILLNESS: Joy Fitzgerald is a 76 y.o. female who presents to the clinic today for:   HPI   Posterior vitreous detachment of both eyes 1 year fu ou fp Pt states her vision has been stable Pt denies any new floaters or FOL Last edited by Morene Rankins, CMA on 04/24/2022  8:29 AM.      Referring physician: Lin Landsman, MD [place],  [place] [id number]  HISTORICAL INFORMATION:   Selected notes from the [facility]    Lab Results  Component Value Date   HGBA1C 6.3 (H) 10/20/2020     CURRENT MEDICATIONS: No current outpatient medications on file. (Ophthalmic Drugs)   No current facility-administered medications for this visit. (Ophthalmic Drugs)   Current Outpatient Medications (Other)  Medication Sig   ALPRAZolam (XANAX) 1 MG tablet Take 1 mg by mouth See admin instructions. Take 1 mg by mouth at bedtime and an additional 1 mg up to two times a day as needed for anxiety   aspirin 81 MG chewable tablet Chew 1 tablet (81 mg total) by mouth 2 (two) times daily.   HYDROmorphone (DILAUDID) 2 MG tablet Take 1 tablet (2 mg total) by mouth every 4 (four) hours as needed for severe pain. (Patient not taking: Reported on 11/29/2020)   losartan (COZAAR) 100 MG tablet Take 100 mg by mouth daily.   meclizine (ANTIVERT) 12.5 MG tablet Take 1 tablet (12.5 mg total) by mouth 2 (two) times daily as needed for dizziness.   metFORMIN (GLUCOPHAGE) 500 MG tablet Take 500 mg by mouth 2 (two) times daily with a meal.   methocarbamol (ROBAXIN) 500 MG tablet Take 1 tablet (500 mg total) by mouth every 6 (six) hours as needed for muscle spasms. (Patient not taking: Reported on 11/29/2020)   nebivolol (BYSTOLIC) 10 MG tablet Take 10 mg by mouth daily.   omeprazole (PRILOSEC) 20 MG capsule Take 20 mg by mouth  daily before breakfast.   pravastatin (PRAVACHOL) 40 MG tablet Take 40 mg by mouth daily after lunch.   No current facility-administered medications for this visit. (Other)      REVIEW OF SYSTEMS: ROS   Negative for: Constitutional, Gastrointestinal, Neurological, Skin, Genitourinary, Musculoskeletal, HENT, Endocrine, Cardiovascular, Eyes, Respiratory, Psychiatric, Allergic/Imm, Heme/Lymph Last edited by Orene Desanctis D, CMA on 04/24/2022  8:29 AM.       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 No pertinent family history.  SOCIAL HISTORY Social History   Tobacco Use   Smoking status: Former    Packs/day: 1.00    Types: Cigarettes    Quit date: 10/20/2016    Years since quitting: 5.5   Smokeless tobacco: Never  Vaping  Use   Vaping Use: Never used  Substance Use Topics   Alcohol use: Not Currently   Drug use: Never         OPHTHALMIC EXAM:  Base Eye Exam     Visual Acuity (ETDRS)       Right Left   Dist [place] 20/30 +1 20/25 +2    Correction: Glasses         Tonometry (Tonopen, 8:34 AM)       Right Left   Pressure 10 10         Pupils       Pupils APD   Right PERRL None   Left PERRL None         Visual Fields       Left Right    Full Full         Extraocular Movement       Right Left    Full, Ortho Full, Ortho         Neuro/Psych     Oriented x3: Yes   Mood/Affect: Normal          Dilation     Both eyes: 1.0% Mydriacyl, 2.5% Phenylephrine @ 8:30 AM           Slit Lamp and Fundus Exam     External Exam       Right Left   External Normal Normal         Slit Lamp Exam       Right Left   Lids/Lashes Normal Normal   Conjunctiva/Sclera White and quiet White and quiet   Cornea Clear Clear   Anterior Chamber Deep and quiet Deep and quiet   Iris Round and reactive Round and reactive   Lens Posterior chamber intraocular lens Posterior chamber intraocular lens   Anterior Vitreous Normal Normal         Fundus Exam       Right Left   Posterior Vitreous Posterior vitreous detachment Posterior vitreous detachment   Disc Normal Normal   C/D Ratio 0.3 0.3   Macula Normal Normal   Vessels DR, NPDR- Mild, scattered microaneurysms peripherally DR, NPDR- Mild   Periphery Normal Normal            IMAGING AND PROCEDURES  Imaging and Procedures for 04/24/22  Color Fundus Photography Optos - OU - Both Eyes       Right Eye Progression has no prior data. Disc findings include normal observations. Macula : microaneurysms.   Left Eye Progression has no prior data. Disc findings include normal observations. Macula : microaneurysms.   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  Will observe  Posterior vitreous detachment of both eyes Physiologic OU     ICD-10-CM   1. Posterior vitreous detachment of both eyes  H43.813 Color Fundus Photography Optos - OU - Both Eyes    2. Mild nonproliferative diabetic retinopathy of both eyes without macular edema associated with type 2 diabetes mellitus (HCC)  W29.9371       1.  PVD OU physiologic no holes or tears  2.  Mild NPDR OU, no progression  3.  [facility] as scheduled  Ophthalmic Meds Ordered this visit:  No orders of the defined types were placed in this  encounter.      Return in about 1 year (around  04/25/2023) for DILATE OU, COLOR FP, OCT.  There are no Patient Instructions on file for this visit.   Explained the diagnoses, plan, and follow up with the patient and they expressed understanding.  Patient expressed understanding of the importance of proper follow up care.   Alford Highland Levell Tavano M.D. Diseases & Surgery of the Retina and Vitreous [facility] 04/24/22     Abbreviations: M myopia (nearsighted); A astigmatism; H hyperopia (farsighted); P presbyopia; Mrx spectacle prescription;  CTL contact lenses; OD right eye; OS left eye; OU both eyes  XT exotropia; ET esotropia; PEK punctate epithelial keratitis; PEE punctate epithelial erosions; DES dry eye syndrome; MGD meibomian gland dysfunction; ATs artificial tears; PFAT's preservative free artificial tears; NSC nuclear sclerotic cataract; PSC posterior subcapsular cataract; ERM epi-retinal membrane; PVD posterior vitreous detachment; RD retinal detachment; DM diabetes mellitus; DR diabetic retinopathy; NPDR non-proliferative diabetic retinopathy; PDR proliferative diabetic retinopathy; CSME clinically significant macular edema; DME diabetic macular edema; dbh dot blot hemorrhages; CWS cotton wool spot; POAG primary open angle glaucoma; C/D cup-to-disc ratio; HVF humphrey visual field; GVF goldmann visual field; OCT optical coherence tomography; IOP intraocular pressure; BRVO Branch retinal vein occlusion; CRVO central retinal vein occlusion; CRAO central retinal artery occlusion; BRAO branch retinal artery occlusion; RT retinal tear; SB scleral buckle; PPV pars plana vitrectomy; VH Vitreous hemorrhage; PRP panretinal laser photocoagulation; IVK intravitreal kenalog; VMT vitreomacular traction; MH Macular hole;  NVD neovascularization of the disc; NVE neovascularization elsewhere; AREDS age related eye disease study; ARMD age related macular degeneration; POAG primary open angle  glaucoma; EBMD epithelial/anterior basement membrane dystrophy; ACIOL anterior chamber intraocular lens; IOL intraocular lens; PCIOL posterior chamber intraocular lens; Phaco/IOL phacoemulsification with intraocular lens placement; PRK photorefractive keratectomy; LASIK laser assisted in situ keratomileusis; HTN hypertension; DM diabetes mellitus; COPD chronic obstructive pulmonary disease

## 2022-04-24 NOTE — Assessment & Plan Note (Signed)
Stable over time.  Will observe

## 2022-07-23 IMAGING — US US RENAL
1 series · 14 of 25 positions shown · non-contrast
Comparison: August 08, 2021 renal ultrasound

CLINICAL DATA: Chronic renal disease. Hypoechoic lesions identified
in kidneys on previous ultrasound.

EXAM:
RENAL / URINARY TRACT ULTRASOUND COMPLETE

[Series 1: us renal · 0.23mm/px · 49 acquisitions, 14 frames shown]
[im 1/49]
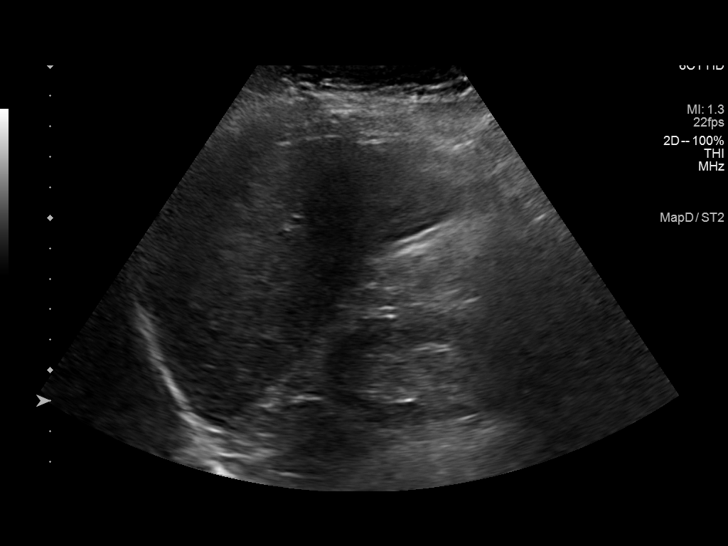
[im 5/49]
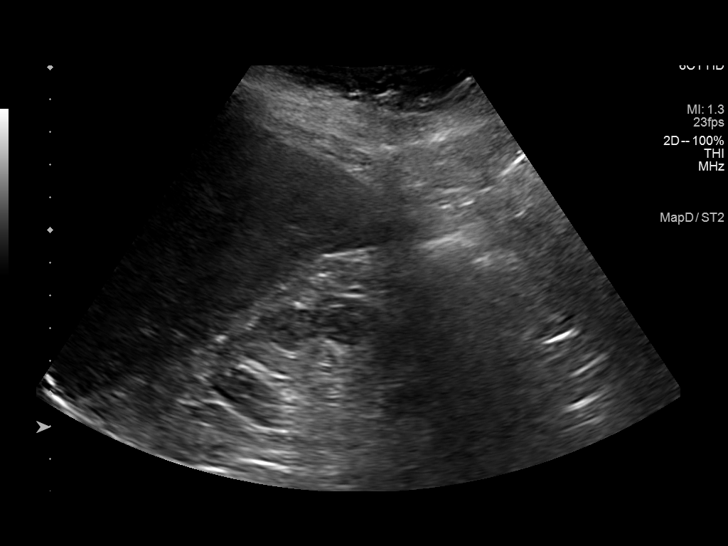
[im 9/49]
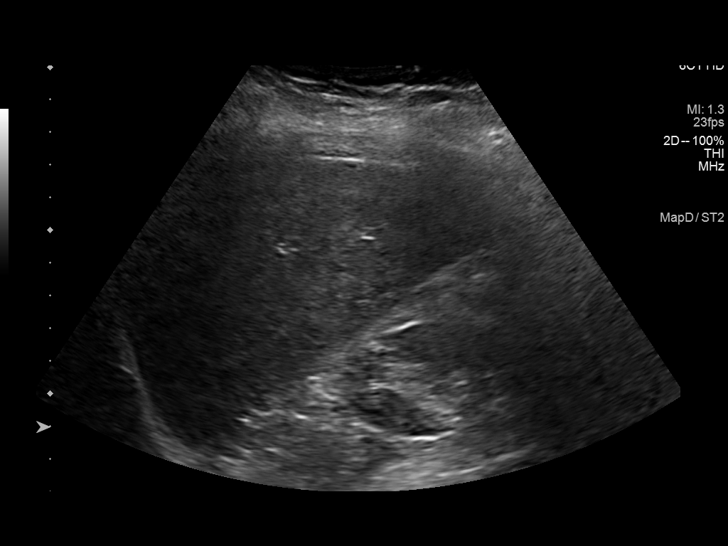
[im 13/49]
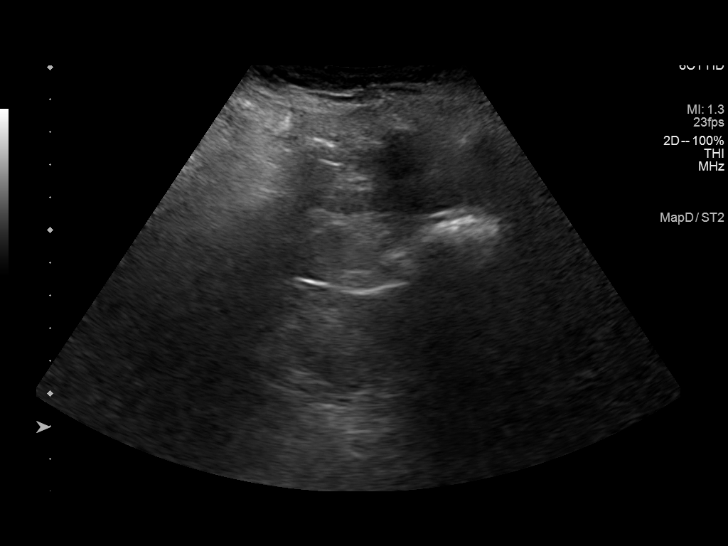
[im 17/49]
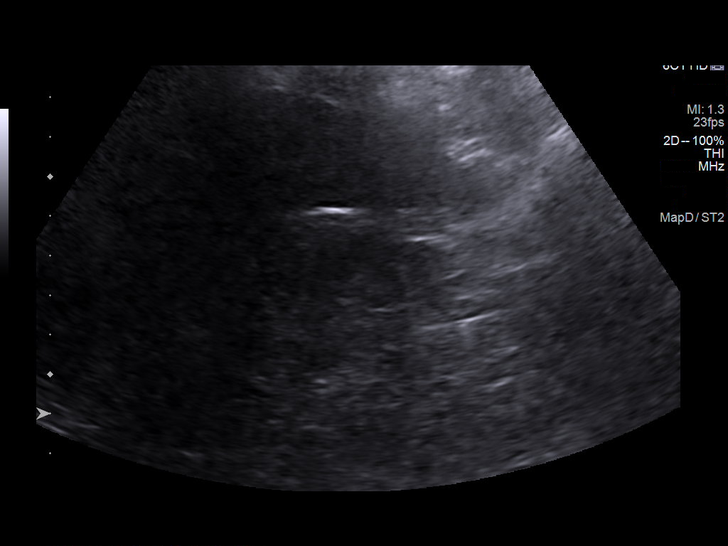
[im 19/49]
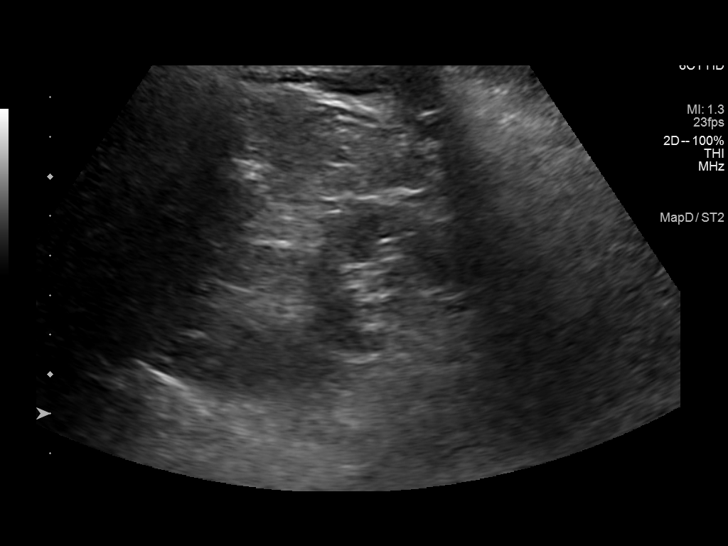
[im 23/49]
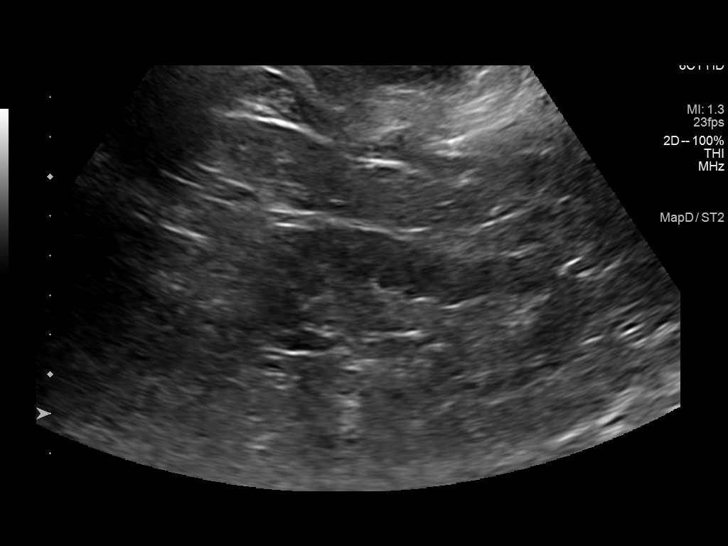
[im 27/49]
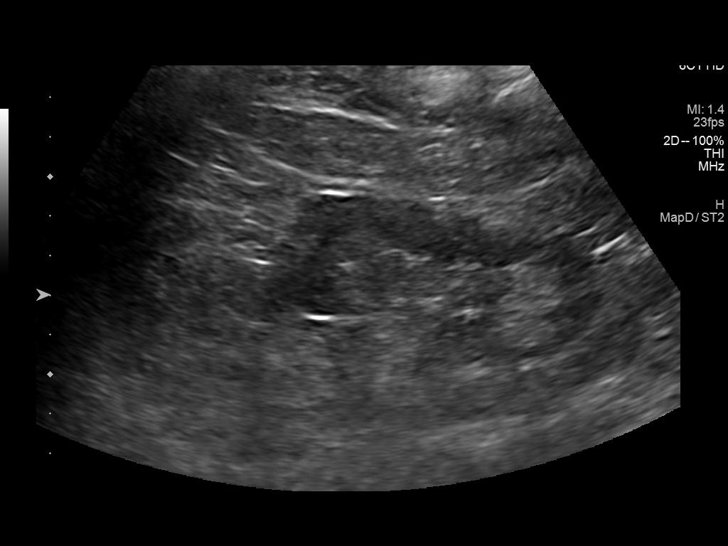
[im 31/49]
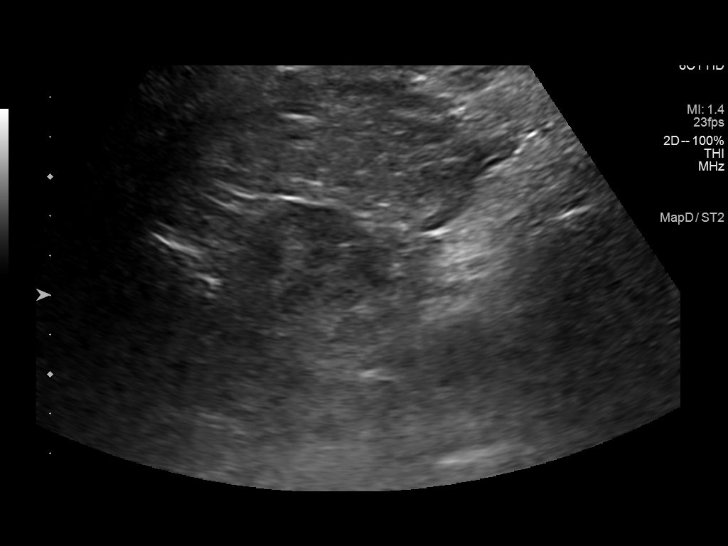
[im 33/49]
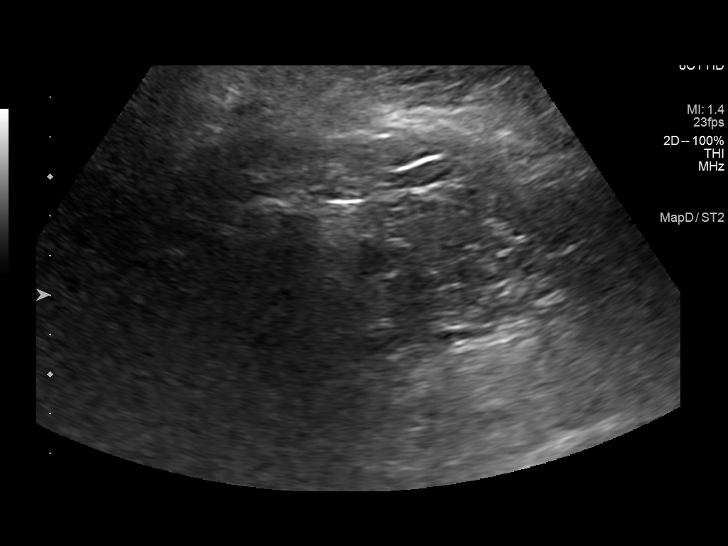
[im 37/49]
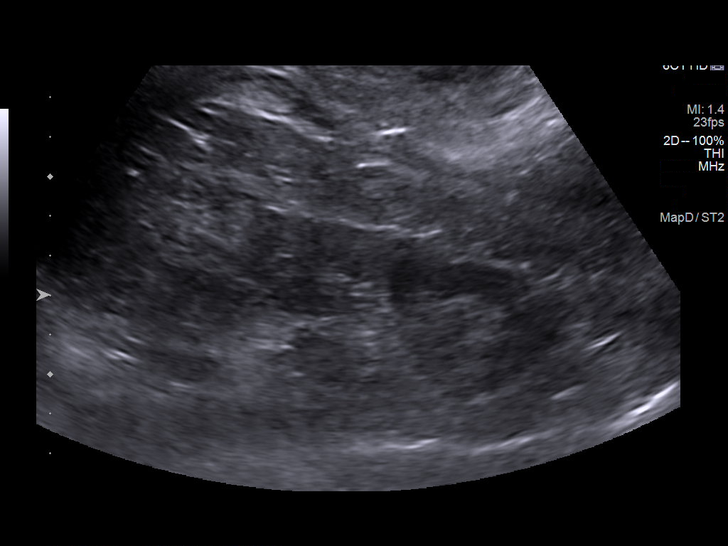
[im 41/49]
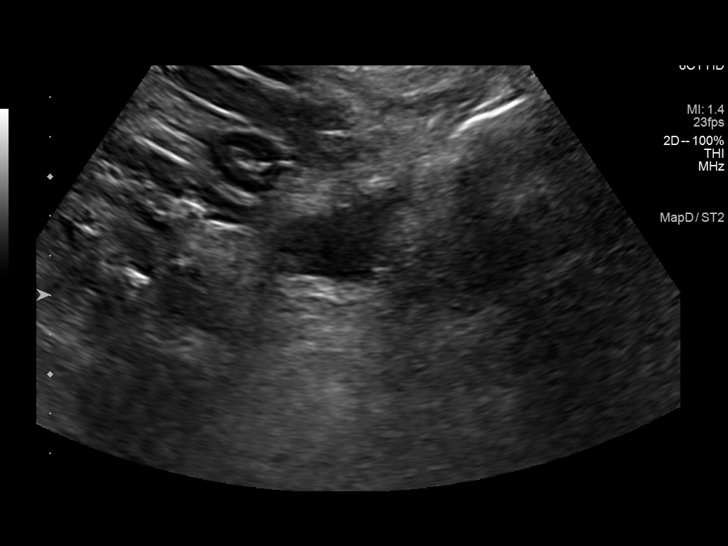
[im 45/49]
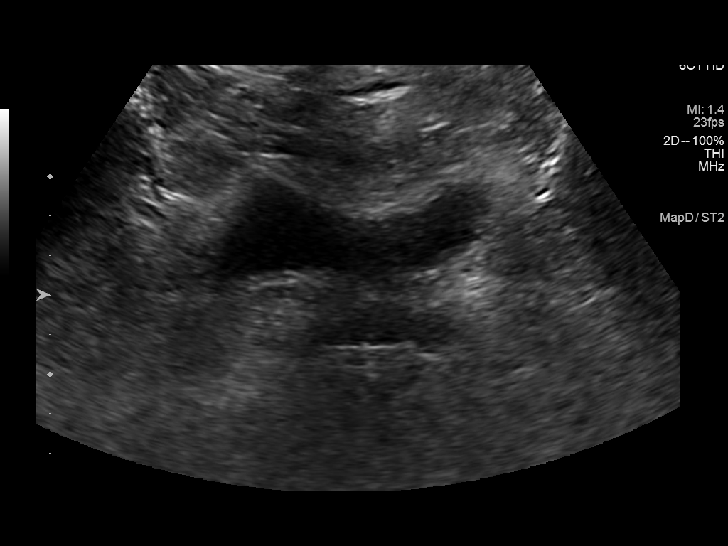
[im 49/49]
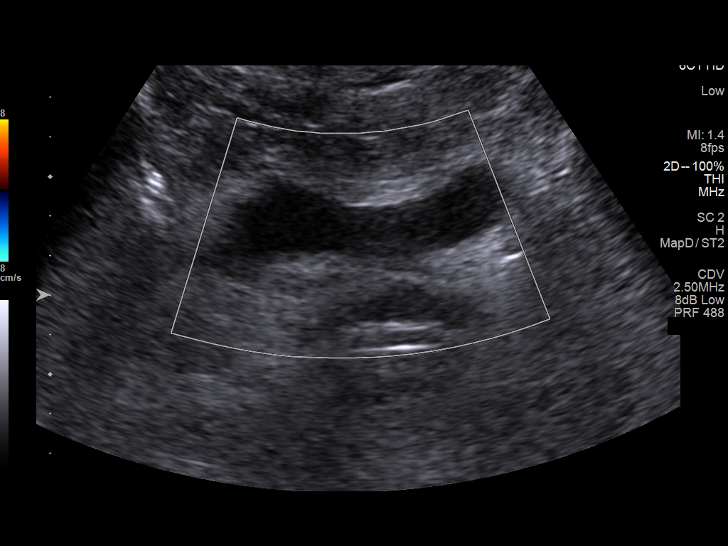

[14 of 25 positions shown; findings below may reference images not displayed]

FINDINGS: Right Kidney:

Renal measurements: 7.3 x 3.2 x 3.5 cm = volume: 43.1 mL.
Echogenicity within normal limits. No mass or hydronephrosis
visualized.

Left Kidney:

Renal measurements: 8.5 x 2.9 x 4.4 cm. Echogenicity within normal
limits. No mass or hydronephrosis visualized.

Bladder:

Suboptimal distention limits evaluation.  No abnormalities noted.

Other:

None.
IMPRESSION: 1. No discrete masses are identified in either kidney.
2. No acute interval changes or abnormalities identified.

## 2023-04-28 ENCOUNTER — Encounter (INDEPENDENT_AMBULATORY_CARE_PROVIDER_SITE_OTHER): Payer: Medicare HMO | Admitting: Ophthalmology

## 2024-01-29 ENCOUNTER — Encounter (HOSPITAL_COMMUNITY): Payer: Self-pay

## 2024-01-29 ENCOUNTER — Other Ambulatory Visit: Payer: Self-pay

## 2024-01-29 ENCOUNTER — Emergency Department (HOSPITAL_COMMUNITY)

## 2024-01-29 ENCOUNTER — Emergency Department (HOSPITAL_COMMUNITY)
Admission: EM | Admit: 2024-01-29 | Discharge: 2024-01-29 | Disposition: A | Discharge status: Home / Self Care | Attending: Emergency Medicine | Admitting: Emergency Medicine

## 2024-01-29 DIAGNOSIS — I129 Hypertensive chronic kidney disease with stage 1 through stage 4 chronic kidney disease, or unspecified chronic kidney disease: Secondary | ICD-10-CM | POA: Insufficient documentation

## 2024-01-29 DIAGNOSIS — Z23 Encounter for immunization: Secondary | ICD-10-CM | POA: Insufficient documentation

## 2024-01-29 DIAGNOSIS — N189 Chronic kidney disease, unspecified: Secondary | ICD-10-CM | POA: Insufficient documentation

## 2024-01-29 DIAGNOSIS — Y92410 Unspecified street and highway as the place of occurrence of the external cause: Secondary | ICD-10-CM | POA: Diagnosis not present

## 2024-01-29 DIAGNOSIS — S50812A Abrasion of left forearm, initial encounter: Secondary | ICD-10-CM | POA: Diagnosis not present

## 2024-01-29 DIAGNOSIS — Z7982 Long term (current) use of aspirin: Secondary | ICD-10-CM | POA: Insufficient documentation

## 2024-01-29 DIAGNOSIS — M25562 Pain in left knee: Secondary | ICD-10-CM | POA: Insufficient documentation

## 2024-01-29 DIAGNOSIS — Z79899 Other long term (current) drug therapy: Secondary | ICD-10-CM | POA: Diagnosis not present

## 2024-01-29 MED ORDER — TETANUS-DIPHTH-ACELL PERTUSSIS 5-2.5-18.5 LF-MCG/0.5 IM SUSY
0.5000 mL | PREFILLED_SYRINGE | Freq: Once | INTRAMUSCULAR | Status: AC
  Administered 2024-01-29: 0.5 mL via INTRAMUSCULAR
  Filled 2024-01-29: qty 0.5

## 2024-01-29 MED ORDER — ACETAMINOPHEN 325 MG PO TABS
650.0000 mg | ORAL_TABLET | Freq: Once | ORAL | Status: DC
  Filled 2024-01-29: qty 2

## 2024-01-29 NOTE — Discharge Instructions (Signed)
 It was a pleasure caring for you today.  Please follow-up with your primary care provider in the next 48 to 72 hours.  Seek emergency care if experiencing any new or worsening symptoms.
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
## 2024-01-29 NOTE — ED Notes (Signed)
 Patient transported to X-ray
